# Patient Record
Sex: Female | Born: 1960 | Race: Black or African American | Hispanic: No | Smoking: Never smoker
Health system: Southern US, Community
[De-identification: ages and names within clinical notes are randomized; demographics above are authoritative.]

## PROBLEM LIST (undated history)

## (undated) HISTORY — PX: TUBAL LIGATION: SHX77

---

## 2010-03-07 DIAGNOSIS — Z01419 Encounter for gynecological examination (general) (routine) without abnormal findings: Secondary | ICD-10-CM | POA: Insufficient documentation

## 2010-03-07 DIAGNOSIS — L821 Other seborrheic keratosis: Secondary | ICD-10-CM | POA: Insufficient documentation

## 2010-03-07 DIAGNOSIS — M25511 Pain in right shoulder: Secondary | ICD-10-CM | POA: Insufficient documentation

## 2010-08-20 DIAGNOSIS — N390 Urinary tract infection, site not specified: Secondary | ICD-10-CM | POA: Insufficient documentation

## 2014-10-08 ENCOUNTER — Encounter: Payer: Self-pay | Admitting: Gastroenterology

## 2014-11-30 ENCOUNTER — Encounter: Payer: Self-pay | Admitting: Gastroenterology

## 2014-12-29 ENCOUNTER — Encounter: Payer: Self-pay | Admitting: Gastroenterology

## 2015-09-01 ENCOUNTER — Encounter: Payer: Self-pay | Admitting: Gastroenterology

## 2015-09-13 ENCOUNTER — Encounter: Payer: Self-pay | Admitting: Gastroenterology

## 2016-05-17 ENCOUNTER — Ambulatory Visit
Admission: RE | Admit: 2016-05-17 | Discharge: 2016-05-17 | Disposition: A | Discharge status: Home / Self Care | Payer: Self-pay | Source: Ambulatory Visit | Attending: Oncology | Admitting: Oncology

## 2016-05-17 ENCOUNTER — Ambulatory Visit: Payer: Self-pay | Attending: Oncology | Admitting: *Deleted

## 2016-05-17 ENCOUNTER — Encounter: Payer: Self-pay | Admitting: *Deleted

## 2016-05-17 VITALS — BP 140/83 | HR 94 | Temp 98.8°F | Ht 65.0 in | Wt 204.0 lb

## 2016-05-17 DIAGNOSIS — Z0000 Encounter for general adult medical examination without abnormal findings: Secondary | ICD-10-CM

## 2016-05-17 NOTE — Progress Notes (Signed)
Subjective:     Patient ID: Jennifer Larson, female   DOB: May 28, 1960, 56 y.o.   MRN: [id number]  HPI   Review of Systems     Objective:   Physical Exam  Pulmonary/Chest: Right breast exhibits no inverted nipple, no mass, no nipple discharge, no skin change and no tenderness. Left breast exhibits no inverted nipple, no mass, no nipple discharge, no skin change and no tenderness. Breasts are asymmetrical.  Right breast larger than the left       Assessment:     56 year old Black female presents to [facility] for clinical breast exam and mammogram.  Last pap in 2015 was negative / negative and pap in 2016 was negative, but no HPV testing.  Explained to patient that next pap will be due in 2020.  Clinical breast exam unremarkable.  Taught self breast awareness.  Blood pressure elevated at 140/83.  She is to recheck her blood pressure at [facility] or [facility], and if remains higher than 140/90 she is to follow-up with her primary care provider.  Hand out on hypertention given to patient.  Patient has been screened for eligibility.  She does not have any insurance, Medicare or Medicaid.  She also meets financial eligibility.  Hand-out given on the Affordable Care Act.    Plan:     Screening mammogram ordered.  Will follow up per [facility] protocol.

## 2016-05-17 NOTE — Patient Instructions (Signed)

## 2016-05-23 ENCOUNTER — Encounter: Payer: Self-pay | Admitting: *Deleted

## 2016-05-23 NOTE — Progress Notes (Signed)
Letter mailed from the [facility] to inform patient of her normal mammogram results.  Patient is to follow-up with annual screening in one year.  HSIS to Christy. 

## 2016-09-15 ENCOUNTER — Emergency Department
Admission: EM | Admit: 2016-09-15 | Discharge: 2016-09-15 | Disposition: A | Discharge status: Home / Self Care | Payer: No Typology Code available for payment source | Attending: Emergency Medicine | Admitting: Emergency Medicine

## 2016-09-15 ENCOUNTER — Encounter: Payer: Self-pay | Admitting: Emergency Medicine

## 2016-09-15 DIAGNOSIS — M545 Low back pain: Secondary | ICD-10-CM | POA: Diagnosis present

## 2016-09-15 MED ORDER — CYCLOBENZAPRINE HCL 10 MG PO TABS: 10.0000 mg | ORAL_TABLET | Freq: Three times a day (TID) | ORAL | 0 refills | Status: DC | PRN

## 2016-09-15 MED ORDER — IBUPROFEN 600 MG PO TABS: 600.0000 mg | ORAL_TABLET | Freq: Three times a day (TID) | ORAL | 0 refills | Status: DC | PRN

## 2016-09-15 MED ORDER — TRAMADOL HCL 50 MG PO TABS: 50.0000 mg | ORAL_TABLET | Freq: Four times a day (QID) | ORAL | 0 refills | Status: DC | PRN
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
## 2016-10-10 ENCOUNTER — Emergency Department
Admission: EM | Admit: 2016-10-10 | Discharge: 2016-10-10 | Disposition: A | Discharge status: Home / Self Care | Payer: Self-pay | Attending: Emergency Medicine | Admitting: Emergency Medicine

## 2016-10-10 ENCOUNTER — Encounter: Payer: Self-pay | Admitting: Emergency Medicine

## 2016-10-10 DIAGNOSIS — Z79899 Other long term (current) drug therapy: Secondary | ICD-10-CM | POA: Insufficient documentation

## 2016-10-10 DIAGNOSIS — J3489 Other specified disorders of nose and nasal sinuses: Secondary | ICD-10-CM

## 2016-10-10 DIAGNOSIS — R42 Dizziness and giddiness: Secondary | ICD-10-CM

## 2016-10-10 LAB — BASIC METABOLIC PANEL
ANION GAP: 6 (ref 5–15)
BUN: 15 mg/dL (ref 6–20)
CHLORIDE: 103 mmol/L (ref 101–111)
CO2: 28 mmol/L (ref 22–32)
Calcium: 9.5 mg/dL (ref 8.9–10.3)
Creatinine, Ser: 1 mg/dL (ref 0.44–1.00)
GFR calc non Af Amer: >60 mL/min (ref >60)
Glucose, Bld: 100 mg/dL — ABNORMAL HIGH (ref 65–99)
Potassium: 3.8 mmol/L (ref 3.5–5.1)
SODIUM: 137 mmol/L (ref 135–145)

## 2016-10-10 LAB — URINALYSIS, COMPLETE (UACMP) WITH MICROSCOPIC
BACTERIA UA: NONE SEEN
BILIRUBIN URINE: NEGATIVE
GLUCOSE, UA: NEGATIVE mg/dL
KETONES UR: NEGATIVE mg/dL
LEUKOCYTES UA: NEGATIVE
NITRITE: NEGATIVE
Protein, ur: NEGATIVE mg/dL
RBC / HPF: NONE SEEN RBC/hpf (ref 0–5)
SPECIFIC GRAVITY, URINE: 1.018 (ref 1.005–1.030)
Squamous Epithelial / LPF: NONE SEEN
WBC, UA: NONE SEEN WBC/hpf (ref 0–5)
pH: 6 (ref 5.0–8.0)

## 2016-10-10 LAB — CBC
HEMATOCRIT: 34.6 % — AB (ref 35.0–47.0)
HEMOGLOBIN: 11.7 g/dL — AB (ref 12.0–16.0)
MCH: 28.9 pg (ref 26.0–34.0)
MCHC: 33.8 g/dL (ref 32.0–36.0)
MCV: 85.6 fL (ref 80.0–100.0)
Platelets: 349 10*3/uL (ref 150–440)
RBC: 4.04 MIL/uL (ref 3.80–5.20)
RDW: 14.3 % (ref 11.5–14.5)
WBC: 11 10*3/uL (ref 3.6–11.0)

## 2016-10-10 MED ORDER — OXYMETAZOLINE HCL 0.05 % NA SOLN
1.0000 | Freq: Once | NASAL | Status: AC
  Administered 2016-10-10: 1 via NASAL
  Filled 2016-10-10 (×2): qty 15

## 2016-10-10 NOTE — ED Triage Notes (Signed)
Says dizziness on and off for 2 days.

## 2016-10-10 NOTE — Discharge Instructions (Signed)
Please purchase an over the counter NETI POT or sinus rinse and use it once to twice a day to help open your sinuses.  Please make an appointment to establish care with a primary care physician within the next week for a recheck.  Return to the ED for any concerns.  It was a pleasure to take care of you today, and thank you for coming to our emergency department.  If you have any questions or concerns before leaving please ask the nurse to grab me and I'm more than happy to go through your aftercare instructions again.  If you were prescribed any opioid pain medication today such as Norco, Vicodin, Percocet, morphine, hydrocodone, or oxycodone please make sure you do not drive when you are taking this medication as it can alter your ability to drive safely.  If you have any concerns once you are home that you are not improving or are in fact getting worse before you can make it to your follow-up appointment, please do not hesitate to call [phone] and come back for further evaluation.  Merrily Brittle, MD  Results for orders placed or performed during the hospital encounter of 10/10/16  Basic metabolic panel  Result Value Ref Range   Sodium 137 135 - 145 mmol/L   Potassium 3.8 3.5 - 5.1 mmol/L   Chloride 103 101 - 111 mmol/L   CO2 28 22 - 32 mmol/L   Glucose, Bld 100 (H) 65 - 99 mg/dL   BUN 15 6 - 20 mg/dL   Creatinine, Ser 4.78 0.44 - 1.00 mg/dL   Calcium 9.5 8.9 - 29.5 mg/dL   GFR calc non Af Amer >60 >60 mL/min   GFR calc Af Amer >60 >60 mL/min   Anion gap 6 5 - 15  CBC  Result Value Ref Range   WBC 11.0 3.6 - 11.0 K/uL   RBC 4.04 3.80 - 5.20 MIL/uL   Hemoglobin 11.7 (L) 12.0 - 16.0 g/dL   HCT 62.1 (L) 30.8 - 65.7 %   MCV 85.6 80.0 - 100.0 fL   MCH 28.9 26.0 - 34.0 pg   MCHC 33.8 32.0 - 36.0 g/dL   RDW 84.6 96.2 - 95.2 %   Platelets 349 150 - 440 K/uL  Urinalysis, Complete w Microscopic  Result Value Ref Range   Color, Urine YELLOW (A) YELLOW   APPearance CLEAR (A) CLEAR   Specific Gravity, Urine 1.018 1.005 - 1.030   pH 6.0 5.0 - 8.0   Glucose, UA NEGATIVE NEGATIVE mg/dL   Hgb urine dipstick SMALL (A) NEGATIVE   Bilirubin Urine NEGATIVE NEGATIVE   Ketones, ur NEGATIVE NEGATIVE mg/dL   Protein, ur NEGATIVE NEGATIVE mg/dL   Nitrite NEGATIVE NEGATIVE   Leukocytes, UA NEGATIVE NEGATIVE   RBC / HPF NONE SEEN 0 - 5 RBC/hpf   WBC, UA NONE SEEN 0 - 5 WBC/hpf   Bacteria, UA NONE SEEN NONE SEEN   Squamous Epithelial / LPF NONE SEEN NONE SEEN

## 2016-10-10 NOTE — ED Provider Notes (Signed)
[facility] Emergency Department Provider Note  ____________________________________________   First MD Initiated Contact with Patient 10/10/16 1959     (approximate)  I have reviewed the triage vital signs and the nursing notes.   HISTORY  Chief Complaint Dizziness   HPI Jennifer Larson is a 56 y.o. female who comes to the emergency department with 2 days of intermittent lightheadedness. She said it feels like everything is spinning and it lasts for roughly 5-10 seconds at a time. Nothing in particular seems to make it better or worse although she thinks it might be somewhat worse when she stands up. She has no palpitations. No chest pain. No family history of sudden cardiac death. She takes no medications. She does feel fullness in her sinuses and says she has had similar symptoms in the past when she has had sinusitis. She said no fevers or chills. No sore throat. No ear pain.   History reviewed. No pertinent past medical history.  There are no active problems to display for this patient.   Past Surgical History:  Procedure Laterality Date  . TUBAL LIGATION      Prior to Admission medications   Medication Sig Start Date End Date Taking? Authorizing Provider  cyclobenzaprine (FLEXERIL) 10 MG tablet Take 1 tablet (10 mg total) by mouth 3 (three) times daily as needed. 09/15/16   Joni Reining, PA-C  ibuprofen (ADVIL,MOTRIN) 600 MG tablet Take 1 tablet (600 mg total) by mouth every 8 (eight) hours as needed. 09/15/16   Joni Reining, PA-C  traMADol (ULTRAM) 50 MG tablet Take 1 tablet (50 mg total) by mouth every 6 (six) hours as needed. 09/15/16 09/15/17  Joni Reining, PA-C    Allergies Patient has no known allergies.  Family History  Problem Relation Age of Onset  . Breast cancer Mother     Social History Social History  Substance Use Topics  . Smoking status: Never Smoker  . Smokeless tobacco: Never Used  . Alcohol use No    Review  of Systems Constitutional: No fever/chills Eyes: No visual changes. ENT: No sore throat. Cardiovascular: Denies chest pain. Respiratory: Denies shortness of breath. Gastrointestinal: No abdominal pain.  No nausea, no vomiting.  No diarrhea.  No constipation. Genitourinary: Negative for dysuria. Musculoskeletal: Negative for back pain. Skin: Negative for rash. Neurological: Negative for headaches, focal weakness or numbness.   ____________________________________________   PHYSICAL EXAM:  VITAL SIGNS: ED Triage Vitals  Enc Vitals Group     BP 10/10/16 1843 (!) 159/90     Pulse Rate 10/10/16 1843 91     Resp 10/10/16 1843 16     Temp 10/10/16 1843 98.9 F (37.2 C)     Temp Source 10/10/16 1843 Oral     SpO2 10/10/16 1843 100 %     Weight 10/10/16 1844 204 lb (92.5 kg)     Height 10/10/16 1844  (1.626 m)     Head Circumference --      Peak Flow --      Pain Score 10/10/16 1842 6     Pain Loc --      Pain Edu? --      Excl. in GC? --     Constitutional: alert and oriented 4 well appearing nontoxic no diaphoresis speaks in full clear sentences Eyes: PERRL EOMI. Head: Atraumatic. Nose: No congestion/rhinnorhea. Mouth/Throat: No trismus Neck: No stridor.   Cardiovascular: Normal rate, regular rhythm. Grossly normal heart sounds.  Good peripheral circulation. Respiratory:  Normal respiratory effort.  No retractions. Lungs CTAB and moving good air Musculoskeletal: No lower extremity edema   Neurologic:  Alert and oriented 4 Cranial nerves II through XII intact No pronator drift 5 out of 5 grips, biceps, triceps, hip flexion, hip extension plantar flexion, dorsiflexion Sensation intact to light touch throughout 2+ DTRs and no ankle clonus Normal finger-nose-finger Ambulates with steady gait  Skin:  Skin is warm, dry and intact. No rash noted. Psychiatric: Mood and affect are normal. Speech and behavior are  normal.    ____________________________________________   DIFFERENTIAL includes but not limited to  peripheral vertigo, central vertigo, lightheadedness, dehydration, sinus congestion, arrhythmia ____________________________________________   LABS (all labs ordered are listed, but only abnormal results are displayed)  Labs Reviewed  BASIC METABOLIC PANEL - Abnormal; Notable for the following:       Result Value   Glucose, Bld 100 (*)    All other components within normal limits  CBC - Abnormal; Notable for the following:    Hemoglobin 11.7 (*)    HCT 34.6 (*)    All other components within normal limits  URINALYSIS, COMPLETE (UACMP) WITH MICROSCOPIC - Abnormal; Notable for the following:    Color, Urine YELLOW (*)    APPearance CLEAR (*)    Hgb urine dipstick SMALL (*)    All other components within normal limits    blood work unremarkable __________________________________________  EKG  ED ECG REPORT I, Merrily Brittle, the attending physician, personally viewed and interpreted this ECG.  Date: 10/10/2016 EKG Time:  Rate: 88 Rhythm: normal sinus rhythm QRS Axis: normal Intervals: normal ST/T Wave abnormalities: normal Narrative Interpretation: no evidence of acute ischemia  ____________________________________________  RADIOLOGY   ____________________________________________   PROCEDURES  Procedure(s) performed: no  Procedures  Critical Care performed: no  Observation: no ____________________________________________   INITIAL IMPRESSION / ASSESSMENT AND PLAN / ED COURSE  Pertinent labs & imaging results that were available during my care of the patient were reviewed by me and considered in my medical decision making (see chart for details).  The patient is very well-appearing with a benign exam. History of intermittent lightheadedness that is not entirely vertiginous of unclear etiology. She is currently asymptomatic. She has no chest pain or  palpitations and her EKG shows no concerning signs for ventricular dysrhythmia. At this point she likely has sinus congestion contributed to her lightheadedness. I'll treat her symptomatically and strict return precautions given.      ____________________________________________   FINAL CLINICAL IMPRESSION(S) / ED DIAGNOSES  Final diagnoses:  Lightheaded  Sinus pressure      NEW MEDICATIONS STARTED DURING THIS VISIT:  Discharge Medication List as of 10/10/2016  8:14 PM       Note:  This document was prepared using Dragon voice recognition software and may include unintentional dictation errors.     Merrily Brittle, MD 10/11/16 [phone]

## 2016-10-10 NOTE — ED Notes (Signed)
FIRST NURSE NOTE-ambulatory to check in without difficulty. Declined wheel chair

## 2016-12-15 ENCOUNTER — Emergency Department: Payer: Self-pay

## 2016-12-15 ENCOUNTER — Observation Stay
Admission: EM | Admit: 2016-12-15 | Discharge: 2016-12-17 | Disposition: A | Discharge status: Home / Self Care | Payer: Self-pay | Attending: Surgery | Admitting: Surgery

## 2016-12-15 ENCOUNTER — Encounter: Payer: Self-pay | Admitting: Radiology

## 2016-12-15 ENCOUNTER — Other Ambulatory Visit: Payer: Self-pay

## 2016-12-15 DIAGNOSIS — R109 Unspecified abdominal pain: Secondary | ICD-10-CM

## 2016-12-15 DIAGNOSIS — K219 Gastro-esophageal reflux disease without esophagitis: Secondary | ICD-10-CM | POA: Insufficient documentation

## 2016-12-15 DIAGNOSIS — K8000 Calculus of gallbladder with acute cholecystitis without obstruction: Principal | ICD-10-CM | POA: Insufficient documentation

## 2016-12-15 DIAGNOSIS — K660 Peritoneal adhesions (postprocedural) (postinfection): Secondary | ICD-10-CM | POA: Insufficient documentation

## 2016-12-15 DIAGNOSIS — Z9889 Other specified postprocedural states: Secondary | ICD-10-CM | POA: Insufficient documentation

## 2016-12-15 DIAGNOSIS — Z803 Family history of malignant neoplasm of breast: Secondary | ICD-10-CM | POA: Insufficient documentation

## 2016-12-15 DIAGNOSIS — K5900 Constipation, unspecified: Secondary | ICD-10-CM | POA: Insufficient documentation

## 2016-12-15 DIAGNOSIS — K810 Acute cholecystitis: Secondary | ICD-10-CM

## 2016-12-15 LAB — URINALYSIS, COMPLETE (UACMP) WITH MICROSCOPIC
BACTERIA UA: NONE SEEN
BILIRUBIN URINE: NEGATIVE
GLUCOSE, UA: NEGATIVE mg/dL
HGB URINE DIPSTICK: NEGATIVE
Ketones, ur: 5 mg/dL — AB
LEUKOCYTES UA: NEGATIVE
NITRITE: NEGATIVE
Protein, ur: 30 mg/dL — AB
SPECIFIC GRAVITY, URINE: 1.016 (ref 1.005–1.030)
pH: 8 (ref 5.0–8.0)

## 2016-12-15 LAB — BASIC METABOLIC PANEL
Anion gap: 8 (ref 5–15)
BUN: 12 mg/dL (ref 6–20)
CALCIUM: 9 mg/dL (ref 8.9–10.3)
CO2: 28 mmol/L (ref 22–32)
Chloride: 104 mmol/L (ref 101–111)
Creatinine, Ser: 0.97 mg/dL (ref 0.44–1.00)
GFR calc Af Amer: >60 mL/min (ref >60)
GLUCOSE: 131 mg/dL — AB (ref 65–99)
Potassium: 3.9 mmol/L (ref 3.5–5.1)
SODIUM: 140 mmol/L (ref 135–145)

## 2016-12-15 LAB — TROPONIN I

## 2016-12-15 LAB — CBC
HCT: 34.4 % — ABNORMAL LOW (ref 35.0–47.0)
Hemoglobin: 11.4 g/dL — ABNORMAL LOW (ref 12.0–16.0)
MCH: 28.2 pg (ref 26.0–34.0)
MCHC: 33.1 g/dL (ref 32.0–36.0)
MCV: 85.4 fL (ref 80.0–100.0)
PLATELETS: 368 10*3/uL (ref 150–440)
RBC: 4.02 MIL/uL (ref 3.80–5.20)
RDW: 13.7 % (ref 11.5–14.5)
WBC: 11.5 10*3/uL — AB (ref 3.6–11.0)

## 2016-12-15 LAB — HEPATIC FUNCTION PANEL
ALT: 15 U/L (ref 14–54)
AST: 22 U/L (ref 15–41)
Albumin: 3.2 g/dL — ABNORMAL LOW (ref 3.5–5.0)
Alkaline Phosphatase: 77 U/L (ref 38–126)
Bilirubin, Direct: <0.1 mg/dL — ABNORMAL LOW (ref 0.1–0.5)
TOTAL PROTEIN: 7.2 g/dL (ref 6.5–8.1)
Total Bilirubin: 0.4 mg/dL (ref 0.3–1.2)

## 2016-12-15 LAB — SURGICAL PCR SCREEN
MRSA, PCR: NEGATIVE
STAPHYLOCOCCUS AUREUS: NEGATIVE

## 2016-12-15 LAB — LIPASE, BLOOD: Lipase: 28 U/L (ref 11–51)

## 2016-12-15 MED ORDER — GI COCKTAIL ~~LOC~~
30.0000 mL | Freq: Once | ORAL | Status: AC
  Administered 2016-12-15: 30 mL via ORAL
  Filled 2016-12-15: qty 30

## 2016-12-15 MED ORDER — LACTATED RINGERS IV SOLN
INTRAVENOUS | Status: DC
  Administered 2016-12-15 – 2016-12-17 (×6): via INTRAVENOUS

## 2016-12-15 MED ORDER — MORPHINE SULFATE (PF) 2 MG/ML IV SOLN: 2.0000 mg | INTRAVENOUS | Status: DC | PRN

## 2016-12-15 MED ORDER — IOPAMIDOL (ISOVUE-300) INJECTION 61%
30.0000 mL | Freq: Once | INTRAVENOUS | Status: AC | PRN
  Administered 2016-12-15: 30 mL via ORAL

## 2016-12-15 MED ORDER — KETOROLAC TROMETHAMINE 30 MG/ML IJ SOLN
30.0000 mg | Freq: Four times a day (QID) | INTRAMUSCULAR | Status: DC | PRN
Start: 2016-12-15 — End: 2016-12-17
  Administered 2016-12-17: 30 mg via INTRAVENOUS
  Filled 2016-12-15: qty 1

## 2016-12-15 MED ORDER — PANTOPRAZOLE SODIUM 40 MG IV SOLR
40.0000 mg | Freq: Every day | INTRAVENOUS | Status: DC
  Administered 2016-12-15 – 2016-12-16 (×2): 40 mg via INTRAVENOUS
  Filled 2016-12-15 (×2): qty 40

## 2016-12-15 MED ORDER — DEXTROSE 5 % IV SOLN
2.0000 g | INTRAVENOUS | Status: DC
  Administered 2016-12-15 – 2016-12-16 (×2): 2 g via INTRAVENOUS
  Filled 2016-12-15 (×3): qty 2

## 2016-12-15 MED ORDER — IOPAMIDOL (ISOVUE-300) INJECTION 61%
100.0000 mL | Freq: Once | INTRAVENOUS | Status: AC | PRN
  Administered 2016-12-15: 100 mL via INTRAVENOUS

## 2016-12-15 MED ORDER — ONDANSETRON HCL 4 MG/2ML IJ SOLN
4.0000 mg | Freq: Four times a day (QID) | INTRAMUSCULAR | Status: DC | PRN
Start: 2016-12-15 — End: 2016-12-17
  Administered 2016-12-16: 4 mg via INTRAVENOUS

## 2016-12-15 MED ORDER — ENOXAPARIN SODIUM 40 MG/0.4ML ~~LOC~~ SOLN
40.0000 mg | SUBCUTANEOUS | Status: DC
  Administered 2016-12-15 – 2016-12-16 (×2): 40 mg via SUBCUTANEOUS
  Filled 2016-12-15 (×2): qty 0.4

## 2016-12-15 MED ORDER — ONDANSETRON 4 MG PO TBDP: 4.0000 mg | ORAL_TABLET | Freq: Four times a day (QID) | ORAL | Status: DC | PRN

## 2016-12-15 MED ORDER — HYDRALAZINE HCL 20 MG/ML IJ SOLN: 10.0000 mg | INTRAMUSCULAR | Status: DC | PRN

## 2016-12-15 NOTE — ED Notes (Signed)
Patient transported to Ultrasound, pt was stable at time of transport and in NAD.

## 2016-12-15 NOTE — ED Notes (Signed)
Patient reports burning feeling in abdomen, chest and back x 2 days intermittently.  Patient states this morning pain is only in mid-abdomen.  Patient reports vomiting yesterday.  Denies vomiting today.

## 2016-12-15 NOTE — ED Notes (Signed)
First Nurse Note:  Patient presents complaining of "burning" in chest and through to stomach X 2 days.

## 2016-12-15 NOTE — H&P (Signed)
Patient ID: Jennifer Larson, female   DOB: 12/28/1960, 56 y.o.   MRN: [id number]  HPI Jennifer Larson is a 56 y.o. female comes to the emergency room for a two-day history of upper abdominal discomfort for, some reflux and some pain. Patient reports that the pain is intermittent moderate in intensity and radiates to her back. She denies any fevers. She did have some nausea and vomiting, also reports some constipation. She had a similar episode in the past and was told that she had gallbladder issues. Only surgical operation its tubal ligation. She is able to perform more than 6 Mets of activity without shortness of breath or chest pain. No evidence of biliary obstruction or cholangitis. Ultrasound personally reviewed there is evidence of multiple gallstones with mild thickening of the gallbladder wall. CT scan also personally reviewed showing evidence consistent with early acute cholecystitis. No evidence of biliary obstruction. Normal common bile duct LFTs are normal as well as her CBC.  HPI  History reviewed. No pertinent past medical history.  Past Surgical History:  Procedure Laterality Date  . TUBAL LIGATION      Family History  Problem Relation Age of Onset  . Breast cancer Mother     Social History Social History   Tobacco Use  . Smoking status: Never Smoker  . Smokeless tobacco: Never Used  Substance Use Topics  . Alcohol use: No  . Drug use: No    No Known Allergies  Current Facility-Administered Medications  Medication Dose Route Frequency Provider Last Rate Last Dose  . cefTRIAXone (ROCEPHIN) 2 g in dextrose 5 % 50 mL IVPB  2 g Intravenous Q24H Joliene Salvador F, MD      . enoxaparin (LOVENOX) injection 40 mg  40 mg Subcutaneous Q24H Vira Chaplin F, MD      . hydrALAZINE (APRESOLINE) injection 10 mg  10 mg Intravenous Q2H PRN Ferlin Fairhurst F, MD      . ketorolac (TORADOL) 30 MG/ML injection 30 mg  30 mg Intravenous Q6H PRN Tison Leibold F, MD      . lactated ringers  infusion   Intravenous Continuous Xyla Leisner F, MD      . morphine 2 MG/ML injection 2 mg  2 mg Intravenous Q3H PRN Marah Park F, MD      . ondansetron (ZOFRAN-ODT) disintegrating tablet 4 mg  4 mg Oral Q6H PRN Chanique Duca F, MD       Or  . ondansetron (ZOFRAN) injection 4 mg  4 mg Intravenous Q6H PRN Oshua Mcconaha F, MD      . pantoprazole (PROTONIX) injection 40 mg  40 mg Intravenous QHS Nga Rabon, Merri Rayiego F, MD       Current Outpatient Medications  Medication Sig Dispense Refill  . cyclobenzaprine (FLEXERIL) 10 MG tablet Take 1 tablet (10 mg total) by mouth 3 (three) times daily as needed. (Patient not taking: Reported on 12/15/2016) 15 tablet 0  . ibuprofen (ADVIL,MOTRIN) 600 MG tablet Take 1 tablet (600 mg total) by mouth every 8 (eight) hours as needed. (Patient not taking: Reported on 12/15/2016) 15 tablet 0  . traMADol (ULTRAM) 50 MG tablet Take 1 tablet (50 mg total) by mouth every 6 (six) hours as needed. (Patient not taking: Reported on 12/15/2016) 20 tablet 0     Review of Systems Full ROS  was asked and was negative except for the information on the HPI  Physical Exam Blood pressure 128/72, pulse (!) 104, temperature 99.6 F (37.6 C), temperature source Oral, resp. rate  17, height 5\' 4"  (1.626 m), weight 89.4 kg (197 lb), SpO2 100 %. CONSTITUTIONAL: NAD EYES: Pupils are equal, round, and reactive to light, Sclera are non-icteric. EARS, NOSE, MOUTH AND THROAT: The oropharynx is clear. The oral mucosa is pink and moist. Hearing is intact to voice. LYMPH NODES:  Lymph nodes in the neck are normal. RESPIRATORY:  Lungs are clear. There is normal respiratory effort, with equal breath sounds bilaterally, and without pathologic use of accessory muscles. CARDIOVASCULAR: Heart is regular without murmurs, gallops, or rubs. GI: The abdomen is soft, Mild TTP Epigastrium and RUQ. No peritonitis.There are no palpable masses. There is no hepatosplenomegaly. There are normal bowel sounds in all  quadrants. GU: Rectal deferred.   MUSCULOSKELETAL: Normal muscle strength and tone. No cyanosis or edema.   SKIN: Turgor is good and there are no pathologic skin lesions or ulcers. NEUROLOGIC: Motor and sensation is grossly normal. Cranial nerves are grossly intact. PSYCH:  Oriented to person, place and time. Affect is normal.  Data Reviewed  I have personally reviewed the patient's imaging, laboratory findings and medical records.    Assessment/Plan Clinical findings consistent with acute cholecystitis confirmed by CT and ultrasound. Discussed with the patient in detail that the best course of therapy will be to admit her to the hospital start IV antibiotics IV fluids and schedule her for a laparoscopic cholecystectomy in the morning. The risks, benefits, complications, treatment options, and expected outcomes were discussed with the patient. The possibilities of bleeding, recurrent infection, finding a normal gallbladder, perforation of viscus organs, damage to surrounding structures, bile leak, abscess formation, needing a drain placed, the need for additional procedures, reaction to medication, pulmonary aspiration,  failure to diagnose a condition, the possible need to convert to an open procedure, and creating a complication requiring transfusion or operation were discussed with the patient. The patient and/or family concurred with the proposed plan, giving informed consent.   [name], MD FACS General Surgeon 12/15/2016, 5:30 PM

## 2016-12-15 NOTE — ED Notes (Signed)
Pt able to ambulate to bathroom independently and denies dizziness or lightheadedness. NAD noted at this time. To aware of wait for General Surgery consult.

## 2016-12-15 NOTE — ED Provider Notes (Signed)
[facility] Emergency Department Provider Note   ____________________________________________   First MD Initiated Contact with Patient 12/15/16 1031     (approximate)  I have reviewed the triage vital signs and the nursing notes.   HISTORY  Chief Complaint Upper abdominal pain   HPI Jennifer Larson is a 56 y.o. female reports she has been having about 2-3 days of discomfort in her upper abdomen described as moderate in intensity and somewhat intermittent.  The pain comes and goes and does radiate towards her back.  She has had nausea but no vomiting today, did vomit yesterday.  No fevers or chills, but reports she does feel a little bit cold.  Denies chest pain, but reports she did experience a burning discomfort in her upper chest after the symptoms started.  She states it feels like "acid".  No trouble breathing.  Reports ongoing discomfort in her upper abdomen which she describes as mild at present.  Denies pregnancy.  No vaginal symptoms.  Postmenopausal.   No past medical history on file.  There are no active problems to display for this patient.   Past Surgical History:  Procedure Laterality Date  . TUBAL LIGATION      Prior to Admission medications   Medication Sig Start Date End Date Taking? Authorizing Provider  cyclobenzaprine (FLEXERIL) 10 MG tablet Take 1 tablet (10 mg total) by mouth 3 (three) times daily as needed. Patient not taking: Reported on 12/15/2016 09/15/16   Joni ReiningSmith, Ronald K, PA-C  ibuprofen (ADVIL,MOTRIN) 600 MG tablet Take 1 tablet (600 mg total) by mouth every 8 (eight) hours as needed. Patient not taking: Reported on 12/15/2016 09/15/16   Joni ReiningSmith, Ronald K, PA-C  traMADol (ULTRAM) 50 MG tablet Take 1 tablet (50 mg total) by mouth every 6 (six) hours as needed. Patient not taking: Reported on 12/15/2016 09/15/16 09/15/17  Joni ReiningSmith, Ronald K, PA-C    Allergies Patient has no known allergies.  Family History  Problem Relation  Age of Onset  . Breast cancer Mother     Social History Social History   Tobacco Use  . Smoking status: Never Smoker  . Smokeless tobacco: Never Used  Substance Use Topics  . Alcohol use: No  . Drug use: No    Review of Systems Constitutional: No fever/chills Eyes: No visual changes. ENT: No sore throat. Cardiovascular: Denies chest pain. Respiratory: Denies shortness of breath. Gastrointestinal: No abdominal pain.  No nausea, no vomiting.  No diarrhea.  No constipation. Genitourinary: Negative for dysuria. Musculoskeletal: Negative for back pain. Skin: Negative for rash. Neurological: Negative for headaches, focal weakness or numbness.    ____________________________________________   PHYSICAL EXAM:  VITAL SIGNS: ED Triage Vitals  Enc Vitals Group     BP 12/15/16 0759 132/74     Pulse Rate 12/15/16 0759 98     Resp 12/15/16 0759 18     Temp 12/15/16 0759 99.6 F (37.6 C)     Temp Source 12/15/16 0759 Oral     SpO2 12/15/16 0759 99 %     Weight 12/15/16 0800 197 lb (89.4 kg)     Height 12/15/16 0800 5\' 4"  (1.626 m)     Head Circumference --      Peak Flow --      Pain Score 12/15/16 0806 9     Pain Loc --      Pain Edu? --      Excl. in GC? --     Constitutional: Alert and oriented. Well  appearing and in no acute distress. Eyes: Conjunctivae are normal. Head: Atraumatic. Nose: No congestion/rhinnorhea. Mouth/Throat: Mucous membranes are moist. Neck: No stridor.   Cardiovascular: Normal rate, regular rhythm. Grossly normal heart sounds.  Good peripheral circulation. Respiratory: Normal respiratory effort.  No retractions. Lungs CTAB. Gastrointestinal: Soft and nontender for mild discomfort in the epigastrium with no focal findings.  No lower abdominal pain is elicited.  No pain at McBurney's point.  Equivocal Murphy. No distention. Musculoskeletal: No lower extremity tenderness nor edema. Neurologic:  Normal speech and language. No gross focal neurologic  deficits are appreciated.  Skin:  Skin is warm, dry and intact. No rash noted. Psychiatric: Mood and affect are normal. Speech and behavior are normal.  ____________________________________________   LABS (all labs ordered are listed, but only abnormal results are displayed)  Labs Reviewed  BASIC METABOLIC PANEL - Abnormal; Notable for the following components:      Result Value   Glucose, Bld 131 (*)    All other components within normal limits  CBC - Abnormal; Notable for the following components:   WBC 11.5 (*)    Hemoglobin 11.4 (*)    HCT 34.4 (*)    All other components within normal limits  HEPATIC FUNCTION PANEL - Abnormal; Notable for the following components:   Albumin 3.2 (*)    Bilirubin, Direct <0.1 (*)    All other components within normal limits  TROPONIN I  LIPASE, BLOOD  URINALYSIS, COMPLETE (UACMP) WITH MICROSCOPIC   ____________________________________________  EKG  Reviewed and interpreted by me at 8 AM 5 QRS 89 QTC 440 Normal sinus rhythm, no evidence of acute ischemia. ____________________________________________  RADIOLOGY  Dg Chest 2 View  Result Date: 12/15/2016 CLINICAL DATA:  Patient with burning sensation in the chest. EXAM: CHEST  2 VIEW COMPARISON:  None. FINDINGS: Normal cardiac and mediastinal contours. No consolidative pulmonary opacities. No pleural effusion or pneumothorax. Regional skeleton unremarkable. IMPRESSION: No active cardiopulmonary disease. Electronically Signed   By: Annia Belt M.D.   On: 12/15/2016 09:06   Dg Abd 2 Views  Result Date: 12/15/2016 CLINICAL DATA:  56 year old female with 5 days of upper abdominal pain, nausea, vomiting. EXAM: ABDOMEN - 2 VIEW COMPARISON:  Chest radiographs 0852 hours today. FINDINGS: Upright and supine views of the abdomen and pelvis. Stable and negative visible lung bases. No pneumoperitoneum. Non obstructed bowel gas pattern. Retained stool in the right colon and redundant appearing  transverse colon. Otherwise normal abdominal and pelvic visceral contours. No osseous abnormality identified. IMPRESSION: Normal bowel gas pattern, no free air. Electronically Signed   By: Odessa Fleming M.D.   On: 12/15/2016 11:06   US Abdomen Limited Ruq  Result Date: 12/15/2016 CLINICAL DATA:  56 year old female with acute abdominal pain for 5 days. EXAM: ULTRASOUND ABDOMEN LIMITED RIGHT UPPER QUADRANT COMPARISON:  None. FINDINGS: Gallbladder: Multiple mobile gallstones are identified, the largest measuring 1.5 cm. Gallbladder wall thickening is present. There is no evidence of pericholecystic fluid or definite sonographic Murphy sign. Common bile duct: Diameter: 3.5 mm. No intrahepatic or extrahepatic biliary dilatation. Liver: No focal lesion identified. Within normal limits in parenchymal echogenicity. Portal vein is patent on color Doppler imaging with normal direction of blood flow towards the liver. IMPRESSION: 1. Cholelithiasis with gallbladder wall thickening suspicious for acute cholecystitis. No biliary dilatation. 2. No other significant abnormalities. Electronically Signed   By: Harmon Pier M.D.   On: 12/15/2016 11:41    Images reviewed, abdomen negative for acute.  Ultrasound demonstrates cholelithiasis with  gallbladder thickening suspicious for acute cholecystitis. ____________________________________________   PROCEDURES  Procedure(s) performed: None  Procedures  Critical Care performed: No  ____________________________________________   INITIAL IMPRESSION / ASSESSMENT AND PLAN / ED COURSE  Pertinent labs & imaging results that were available during my care of the patient were reviewed by me and considered in my medical decision making (see chart for details).  Differential diagnosis includes, but is not limited to, biliary disease (biliary colic, acute cholecystitis, cholangitis, choledocholithiasis, etc), intrathoracic causes for epigastric abdominal pain including ACS,  gastritis, duodenitis, pancreatitis, small bowel or large bowel obstruction, abdominal aortic aneurysm, hernia, and gastritis.   Clinical Course as of Dec 16 1523  Fri Dec 15, 2016  1437 Patient is resting comfortably, understanding of waiting for general surgery Dr. Everlene FarrierPabon.Spoke with Dr. Valaria GoodPabone (now out of surgery), he reports he will be down to see the patient shortly  [MQ]    Clinical Course User Index [MQ] Sharyn CreamerQuale, Mark, MD   ----------------------------------------- 3:23 PM on 12/15/2016 -----------------------------------------  Dr. Everlene FarrierPabon has seen the patient.  He discussed with the patient, and advised her symptoms are somewhat atypical for gallbladder disease.  He discussed with the patient and he and patient are agreeable with the plan to obtain a CT scan of the abdomen and pelvis to further evaluate, he would then like to be updated thereafter to develop a final plan on whether or not the patient will come inpatient or potentially go home under his close follow-up.  CT abdomen pelvis ordered.  Ongoing care assigned to Dr. Marisa SeverinSiadecki with plan to follow-up on CT abdomen and pelvis, urinalysis, and discuss with Dr. Everlene FarrierPabon for further recommendations thereafter  ____________________________________________   FINAL CLINICAL IMPRESSION(S) / ED DIAGNOSES  Final diagnoses:  Abdominal pain      NEW MEDICATIONS STARTED DURING THIS VISIT:  This SmartLink is deprecated. Use AVSMEDLIST instead to display the medication list for a patient.   Note:  This document was prepared using Dragon voice recognition software and may include unintentional dictation errors.     Sharyn CreamerQuale, Mark, MD 12/15/16 1525

## 2016-12-15 NOTE — ED Notes (Signed)
Patient to radiology.

## 2016-12-15 NOTE — Progress Notes (Signed)
CH responded to an OR for an AD. Pt is awake and alert. Several family members are beside. CH educated Pt on the AD. Pt will consider the document and completion at a later time.    12/15/16 1900  Clinical Encounter Type  Visited With Patient;Patient and family together  Visit Type Initial;Spiritual support  Referral From Nurse  Consult/Referral To Chaplain  Spiritual Encounters  Spiritual Needs Literature

## 2016-12-15 NOTE — ED Triage Notes (Signed)
Patient amb. To ED complaining "burning and hurting" in her chest X 5 days.  Started Sun PM with N&V.  Felt better yesterday and tried to eat.  Here this AM complaining of pain in chest and back all night, but now it's not hurting in her chest this AM, but in her stomach. "I need to use the bathroom but can't".

## 2016-12-15 NOTE — ED Notes (Signed)
Pt currently drinking oral contrast. Pt denies nausea at this time. Call bell in reach to alert RN when finished

## 2016-12-15 NOTE — ED Notes (Signed)
Patient transported to CT 

## 2016-12-15 NOTE — ED Notes (Signed)
[facility] by this Nurse - Lillia AbedKaren Oather Muilenburg RN.

## 2016-12-16 ENCOUNTER — Observation Stay: Payer: Self-pay | Admitting: Anesthesiology

## 2016-12-16 ENCOUNTER — Encounter
Admission: EM | Disposition: A | Discharge status: Home / Self Care | Payer: Self-pay | Source: Home / Self Care | Attending: Emergency Medicine

## 2016-12-16 ENCOUNTER — Encounter: Payer: Self-pay | Admitting: Anesthesiology

## 2016-12-16 HISTORY — PX: CHOLECYSTECTOMY: SHX55

## 2016-12-16 LAB — CBC
HCT: 30.6 % — ABNORMAL LOW (ref 35.0–47.0)
Hemoglobin: 10.2 g/dL — ABNORMAL LOW (ref 12.0–16.0)
MCH: 28.5 pg (ref 26.0–34.0)
MCHC: 33.2 g/dL (ref 32.0–36.0)
MCV: 85.8 fL (ref 80.0–100.0)
PLATELETS: 313 10*3/uL (ref 150–440)
RBC: 3.57 MIL/uL — AB (ref 3.80–5.20)
RDW: 13.7 % (ref 11.5–14.5)
WBC: 10.7 10*3/uL (ref 3.6–11.0)

## 2016-12-16 LAB — COMPREHENSIVE METABOLIC PANEL
ALT: 15 U/L (ref 14–54)
ANION GAP: 7 (ref 5–15)
AST: 19 U/L (ref 15–41)
Albumin: 2.7 g/dL — ABNORMAL LOW (ref 3.5–5.0)
Alkaline Phosphatase: 71 U/L (ref 38–126)
BUN: 6 mg/dL (ref 6–20)
CALCIUM: 8.4 mg/dL — AB (ref 8.9–10.3)
CHLORIDE: 105 mmol/L (ref 101–111)
CO2: 26 mmol/L (ref 22–32)
Creatinine, Ser: 0.68 mg/dL (ref 0.44–1.00)
GFR calc non Af Amer: >60 mL/min (ref >60)
Glucose, Bld: 107 mg/dL — ABNORMAL HIGH (ref 65–99)
POTASSIUM: 3.6 mmol/L (ref 3.5–5.1)
SODIUM: 138 mmol/L (ref 135–145)
Total Bilirubin: 0.7 mg/dL (ref 0.3–1.2)
Total Protein: 6.5 g/dL (ref 6.5–8.1)

## 2016-12-16 SURGERY — LAPAROSCOPIC CHOLECYSTECTOMY
Anesthesia: General

## 2016-12-16 MED ORDER — FENTANYL CITRATE (PF) 100 MCG/2ML IJ SOLN
INTRAMUSCULAR | Status: AC
  Filled 2016-12-16: qty 2

## 2016-12-16 MED ORDER — BUPIVACAINE-EPINEPHRINE (PF) 0.25% -1:200000 IJ SOLN
INTRAMUSCULAR | Status: AC
  Filled 2016-12-16: qty 30

## 2016-12-16 MED ORDER — PROPOFOL 10 MG/ML IV BOLUS
INTRAVENOUS | Status: AC
  Filled 2016-12-16: qty 20

## 2016-12-16 MED ORDER — OXYCODONE-ACETAMINOPHEN 5-325 MG PO TABS
1.0000 | ORAL_TABLET | ORAL | Status: DC | PRN
  Administered 2016-12-16: 2 via ORAL
  Administered 2016-12-16 (×2): 1 via ORAL
  Administered 2016-12-17: 2 via ORAL
  Filled 2016-12-16: qty 1
  Filled 2016-12-16 (×2): qty 2
  Filled 2016-12-16: qty 1

## 2016-12-16 MED ORDER — LIDOCAINE HCL (PF) 2 % IJ SOLN
INTRAMUSCULAR | Status: AC
  Filled 2016-12-16: qty 10

## 2016-12-16 MED ORDER — LIDOCAINE HCL (CARDIAC) 20 MG/ML IV SOLN
INTRAVENOUS | Status: DC | PRN
  Administered 2016-12-16: 40 mg via INTRAVENOUS

## 2016-12-16 MED ORDER — MIDAZOLAM HCL 2 MG/2ML IJ SOLN
INTRAMUSCULAR | Status: DC | PRN
  Administered 2016-12-16: 2 mg via INTRAVENOUS

## 2016-12-16 MED ORDER — BUPIVACAINE-EPINEPHRINE 0.25% -1:200000 IJ SOLN
INTRAMUSCULAR | Status: DC | PRN
  Administered 2016-12-16: 30 mL

## 2016-12-16 MED ORDER — PROMETHAZINE HCL 25 MG/ML IJ SOLN: 6.2500 mg | INTRAMUSCULAR | Status: DC | PRN

## 2016-12-16 MED ORDER — MIDAZOLAM HCL 2 MG/2ML IJ SOLN
INTRAMUSCULAR | Status: AC
Start: 2016-12-16 — End: 2016-12-16
  Filled 2016-12-16: qty 2

## 2016-12-16 MED ORDER — KETOROLAC TROMETHAMINE 30 MG/ML IJ SOLN
INTRAMUSCULAR | Status: DC | PRN
  Administered 2016-12-16: 30 mg via INTRAVENOUS

## 2016-12-16 MED ORDER — ROCURONIUM BROMIDE 100 MG/10ML IV SOLN
INTRAVENOUS | Status: DC | PRN
  Administered 2016-12-16: 40 mg via INTRAVENOUS
  Administered 2016-12-16: 10 mg via INTRAVENOUS

## 2016-12-16 MED ORDER — FENTANYL CITRATE (PF) 100 MCG/2ML IJ SOLN
INTRAMUSCULAR | Status: DC | PRN
  Administered 2016-12-16 (×4): 50 ug via INTRAVENOUS

## 2016-12-16 MED ORDER — PROPOFOL 10 MG/ML IV BOLUS
INTRAVENOUS | Status: DC | PRN
  Administered 2016-12-16: 160 mg via INTRAVENOUS

## 2016-12-16 MED ORDER — FENTANYL CITRATE (PF) 100 MCG/2ML IJ SOLN
25.0000 ug | INTRAMUSCULAR | Status: DC | PRN
  Administered 2016-12-16 (×2): 50 ug via INTRAVENOUS

## 2016-12-16 MED ORDER — FENTANYL CITRATE (PF) 100 MCG/2ML IJ SOLN
INTRAMUSCULAR | Status: AC
  Administered 2016-12-16: 11:00:00
  Filled 2016-12-16: qty 2

## 2016-12-16 MED ORDER — ROCURONIUM BROMIDE 50 MG/5ML IV SOLN
INTRAVENOUS | Status: AC
  Filled 2016-12-16: qty 1

## 2016-12-16 MED ORDER — ACETAMINOPHEN 10 MG/ML IV SOLN
INTRAVENOUS | Status: AC
  Filled 2016-12-16: qty 100

## 2016-12-16 MED ORDER — DEXAMETHASONE SODIUM PHOSPHATE 10 MG/ML IJ SOLN
INTRAMUSCULAR | Status: DC | PRN
  Administered 2016-12-16: 10 mg via INTRAVENOUS

## 2016-12-16 SURGICAL SUPPLY — 47 items
APPLICATOR COTTON TIP 6IN STRL (MISCELLANEOUS) ×2 IMPLANT
APPLIER CLIP 5 13 M/L LIGAMAX5 (MISCELLANEOUS) ×2
BLADE SURG 15 STRL LF DISP TIS (BLADE) ×1 IMPLANT
BLADE SURG 15 STRL SS (BLADE) ×1
BULB RESERV EVAC DRAIN JP 100C (MISCELLANEOUS) ×2 IMPLANT
CANISTER SUCT 1200ML W/VALVE (MISCELLANEOUS) ×2 IMPLANT
CHLORAPREP W/TINT 26ML (MISCELLANEOUS) ×2 IMPLANT
CHOLANGIOGRAM CATH TAUT (CATHETERS) IMPLANT
CLEANER CAUTERY TIP 5X5 PAD (MISCELLANEOUS) ×1 IMPLANT
CLIP APPLIE 5 13 M/L LIGAMAX5 (MISCELLANEOUS) ×1 IMPLANT
DECANTER SPIKE VIAL GLASS SM (MISCELLANEOUS) IMPLANT
DERMABOND ADVANCED (GAUZE/BANDAGES/DRESSINGS) ×1
DERMABOND ADVANCED .7 DNX12 (GAUZE/BANDAGES/DRESSINGS) ×1 IMPLANT
DRAIN CHANNEL JP 19F (MISCELLANEOUS) ×2 IMPLANT
DRAPE C-ARM XRAY 36X54 (DRAPES) IMPLANT
ELECT CAUTERY BLADE 6.4 (BLADE) ×2 IMPLANT
ELECT REM PT RETURN 9FT ADLT (ELECTROSURGICAL) ×2
ELECTRODE REM PT RTRN 9FT ADLT (ELECTROSURGICAL) ×1 IMPLANT
GLOVE BIO SURGEON STRL SZ7 (GLOVE) ×8 IMPLANT
GOWN STRL REUS W/ TWL LRG LVL3 (GOWN DISPOSABLE) ×2 IMPLANT
GOWN STRL REUS W/TWL LRG LVL3 (GOWN DISPOSABLE) ×2
IRRIGATION STRYKERFLOW (MISCELLANEOUS) ×1 IMPLANT
IRRIGATOR STRYKERFLOW (MISCELLANEOUS) ×2
IV CATH ANGIO 12GX3 LT BLUE (NEEDLE) IMPLANT
IV NS 1000ML (IV SOLUTION) ×1
IV NS 1000ML BAXH (IV SOLUTION) ×1 IMPLANT
L-HOOK LAP DISP 36CM (ELECTROSURGICAL) ×2
LHOOK LAP DISP 36CM (ELECTROSURGICAL) ×1 IMPLANT
NEEDLE HYPO 22GX1.5 SAFETY (NEEDLE) ×2 IMPLANT
PACK LAP CHOLECYSTECTOMY (MISCELLANEOUS) ×2 IMPLANT
PAD CLEANER CAUTERY TIP 5X5 (MISCELLANEOUS) ×1
PENCIL ELECTRO HAND CTR (MISCELLANEOUS) ×2 IMPLANT
POUCH SPECIMEN RETRIEVAL 10MM (ENDOMECHANICALS) ×2 IMPLANT
SCISSORS METZENBAUM CVD 33 (INSTRUMENTS) ×2 IMPLANT
SLEEVE ENDOPATH XCEL 5M (ENDOMECHANICALS) ×4 IMPLANT
SOL ANTI-FOG 6CC FOG-OUT (MISCELLANEOUS) ×1 IMPLANT
SOL FOG-OUT ANTI-FOG 6CC (MISCELLANEOUS) ×1
SPONGE LAP 18X18 5 PK (GAUZE/BANDAGES/DRESSINGS) ×2 IMPLANT
STOPCOCK 4 WAY LG BORE MALE ST (IV SETS) IMPLANT
SUT ETHIBOND 0 MO6 C/R (SUTURE) IMPLANT
SUT MNCRL AB 4-0 PS2 18 (SUTURE) ×2 IMPLANT
SUT VICRYL 0 AB UR-6 (SUTURE) ×4 IMPLANT
SYR 20CC LL (SYRINGE) ×2 IMPLANT
TROCAR XCEL BLUNT TIP 100MML (ENDOMECHANICALS) ×2 IMPLANT
TROCAR XCEL NON-BLD 5MMX100MML (ENDOMECHANICALS) ×2 IMPLANT
TUBING INSUFFLATION (TUBING) ×2 IMPLANT
WATER STERILE IRR 1000ML POUR (IV SOLUTION) IMPLANT

## 2016-12-16 NOTE — Anesthesia Preprocedure Evaluation (Signed)
Anesthesia Evaluation  Patient identified by MRN, date of birth, ID band Patient awake    Reviewed: Allergy & Precautions, H&P , NPO status , Patient's Chart, lab work & pertinent test results, reviewed documented beta blocker date and time   History of Anesthesia Complications Negative for: history of anesthetic complications  Airway Mallampati: III  TM Distance: >3 FB Neck ROM: full    Dental  (+) Dental Advidsory Given, Missing   Pulmonary neg pulmonary ROS,           Cardiovascular Exercise Tolerance: Good negative cardio ROS       Neuro/Psych negative neurological ROS  negative psych ROS   GI/Hepatic negative GI ROS, Neg liver ROS,   Endo/Other  negative endocrine ROS  Renal/GU negative Renal ROS  negative genitourinary   Musculoskeletal   Abdominal   Peds  Hematology negative hematology ROS (+)   Anesthesia Other Findings History reviewed. No pertinent past medical history.   Reproductive/Obstetrics negative OB ROS                             Anesthesia Physical Anesthesia Plan  ASA: II  Anesthesia Plan: General   Post-op Pain Management:    Induction: Intravenous  PONV Risk Score and Plan: 3 and Ondansetron and Dexamethasone  Airway Management Planned: Oral ETT  Additional Equipment:   Intra-op Plan:   Post-operative Plan: Extubation in OR  Informed Consent: I have reviewed the patients History and Physical, chart, labs and discussed the procedure including the risks, benefits and alternatives for the proposed anesthesia with the patient or authorized representative who has indicated his/her understanding and acceptance.   Dental Advisory Given  Plan Discussed with: Anesthesiologist, CRNA and Surgeon  Anesthesia Plan Comments:         Anesthesia Quick Evaluation

## 2016-12-16 NOTE — Transfer of Care (Signed)
Immediate Anesthesia Transfer of Care Note  Patient: Jennifer Larson  Procedure(s) Performed: LAPAROSCOPIC CHOLECYSTECTOMY (N/A )  Patient Location: PACU  Anesthesia Type:General  Level of Consciousness: sedated  Airway & Oxygen Therapy: Patient Spontanous Breathing and Patient connected to face mask oxygen  Post-op Assessment: Report given to RN and Post -op Vital signs reviewed and stable  Post vital signs: Reviewed and stable  Last Vitals:  Vitals:   12/16/16 0510 12/16/16 1010  BP: 132/73 (P) 138/68  Pulse: 94   Resp:  (!) (P) 21  Temp: 37.2 C (P) 37 C  SpO2: 100% (P) 100%    Last Pain:  Vitals:   12/16/16 0510  TempSrc: Oral  PainSc:          Complications: No apparent anesthesia complications

## 2016-12-16 NOTE — Anesthesia Procedure Notes (Signed)
Procedure Name: Intubation Date/Time: 12/16/2016 8:22 AM Performed by: Allean Found, CRNA Pre-anesthesia Checklist: Patient identified, Patient being monitored, Timeout performed, Emergency Drugs available and Suction available Patient Re-evaluated:Patient Re-evaluated prior to induction Oxygen Delivery Method: Circle system utilized Preoxygenation: Pre-oxygenation with 100% oxygen Induction Type: IV induction Ventilation: Mask ventilation without difficulty Laryngoscope Size: Mac and 3 Grade View: Grade I Tube type: Oral Tube size: 7.0 mm Number of attempts: 1 Airway Equipment and Method: Stylet Placement Confirmation: ETT inserted through vocal cords under direct vision,  positive ETCO2 and breath sounds checked- equal and bilateral Secured at: 21 cm Tube secured with: Tape Dental Injury: Teeth and Oropharynx as per pre-operative assessment

## 2016-12-16 NOTE — Op Note (Signed)
Laparoscopic Cholecystectomy  Pre-operative Diagnosis: Acute cholecystitis  Post-operative Diagnosis: same  Procedure: laparoscopic cholecystectomy  Surgeon: Sterling Bigiego Pabon, MD FACS  Anesthesia: Gen. with endotracheal tube    Findings:  Cholecystitis w significant inflammatory response around liver bed  Estimated Blood Loss: 50cc         Drains: 19 FR RUQ         Specimens: Gallbladder           Complications: none   Procedure Details  The patient was seen again in the Holding Room. The benefits, complications, treatment options, and expected outcomes were discussed with the patient. The risks of bleeding, infection, recurrence of symptoms, failure to resolve symptoms, bile duct damage, bile duct leak, retained common bile duct stone, bowel injury, any of which could require further surgery and/or ERCP, stent, or papillotomy were reviewed with the patient. The likelihood of improving the patient's symptoms with return to their baseline status is good.  The patient and/or family concurred with the proposed plan, giving informed consent.  The patient was taken to Operating Room, identified as Earl Litesatricia Reber and the procedure verified as Laparoscopic Cholecystectomy.  A Time Out was held and the above information confirmed.  Prior to the induction of general anesthesia, antibiotic prophylaxis was administered. VTE prophylaxis was in place. General endotracheal anesthesia was then administered and tolerated well. After the induction, the abdomen was prepped with Chloraprep and draped in the sterile fashion. The patient was positioned in the supine position.  Cut down technique was used to enter the abdominal cavity and a Hasson trochar was placed after two vicryl stitches were anchored to the fascia. Pneumoperitoneum was then created with CO2 and tolerated well without any adverse changes in the patient's vital signs.  Three 5-mm ports were placed in the right upper quadrant all under  direct vision. All skin incisions  were infiltrated with a local anesthetic agent before making the incision and placing the trocars.   The patient was positioned  in reverse Trendelenburg, tilted slightly to the patient's left.  The gallbladder was identified, the fundus grasped and retracted cephalad. Adhesions were lysed bluntly. The infundibulum was grasped and retracted laterally, exposing the peritoneum overlying the triangle of Calot. This was then divided and exposed in a blunt fashion. An extended critical view of the cystic duct and cystic artery was obtained.  The cystic duct was clearly identified and bluntly dissected.   Artery and duct were double clipped and divided.  The gallbladder was taken from the gallbladder fossa in a retrograde fashion with the electrocautery. The gallbladder was removed and placed in an Endocatch bag. The liver bed was irrigated and inspected. Hemostasis was achieved with the electrocautery. Copious irrigation was utilized and was repeatedly aspirated until clear.  The gallbladder and Endocatch sac were then removed through a port site.  19 FR blake drain placed GB bed under visualization.  Inspection of the right upper quadrant was performed. No bleeding, bile duct injury or leak, or bowel injury was noted. Pneumoperitoneum was released.  The periumbilical port site was closed with interrumpted 0 Vicryl sutures. 4-0 subcuticular Monocryl was used to close the skin. Dermabond was  applied.  The patient was then extubated and brought to the recovery room in stable condition. Sponge, lap, and needle counts were correct at closure and at the conclusion of the case.               Sterling Bigiego Pabon, MD, FACS

## 2016-12-16 NOTE — Anesthesia Post-op Follow-up Note (Signed)
Anesthesia QCDR form completed.        

## 2016-12-16 NOTE — Anesthesia Postprocedure Evaluation (Signed)
Anesthesia Post Note  Patient: Jennifer Larson  Procedure(s) Performed: LAPAROSCOPIC CHOLECYSTECTOMY (N/A )  Patient location during evaluation: PACU Anesthesia Type: General Level of consciousness: awake and alert Pain management: pain level controlled Vital Signs Assessment: post-procedure vital signs reviewed and stable Respiratory status: spontaneous breathing, nonlabored ventilation, respiratory function stable and patient connected to nasal cannula oxygen Cardiovascular status: blood pressure returned to baseline and stable Postop Assessment: no apparent nausea or vomiting Anesthetic complications: no     Last Vitals:  Vitals:   12/16/16 1109 12/16/16 1151  BP: 128/68 133/63  Pulse: 94 93  Resp:    Temp: 36.7 C 36.7 C  SpO2: 97% 100%    Last Pain:  Vitals:   12/16/16 1203  TempSrc:   PainSc: 9                  Lenard SimmerAndrew Valery Amedee

## 2016-12-16 NOTE — Progress Notes (Signed)
Preoperative Review   Patient is met preoperatively. The history is reviewed in the chart and with the patient. I personally reviewed the options and rationale as well as the risks of this procedure that have been previously discussed with the patient. All questions asked by the patient and/or family were answered to their satisfaction.  Patient agrees to proceed with this procedure at this time.  Dhanvi Boesen M.D. FACS   

## 2016-12-17 ENCOUNTER — Encounter: Payer: Self-pay | Admitting: Surgery

## 2016-12-17 LAB — COMPREHENSIVE METABOLIC PANEL
ALBUMIN: 2.4 g/dL — AB (ref 3.5–5.0)
ALK PHOS: 65 U/L (ref 38–126)
ALT: 35 U/L (ref 14–54)
ANION GAP: 4 — AB (ref 5–15)
AST: 40 U/L (ref 15–41)
BUN: 9 mg/dL (ref 6–20)
CALCIUM: 8.4 mg/dL — AB (ref 8.9–10.3)
CO2: 26 mmol/L (ref 22–32)
Chloride: 107 mmol/L (ref 101–111)
Creatinine, Ser: 0.64 mg/dL (ref 0.44–1.00)
GFR calc non Af Amer: >60 mL/min (ref >60)
GLUCOSE: 146 mg/dL — AB (ref 65–99)
POTASSIUM: 3.9 mmol/L (ref 3.5–5.1)
SODIUM: 137 mmol/L (ref 135–145)
TOTAL PROTEIN: 6.3 g/dL — AB (ref 6.5–8.1)
Total Bilirubin: 0.2 mg/dL — ABNORMAL LOW (ref 0.3–1.2)

## 2016-12-17 LAB — CBC
HCT: 29.7 % — ABNORMAL LOW (ref 35.0–47.0)
HEMOGLOBIN: 9.8 g/dL — AB (ref 12.0–16.0)
MCH: 28.2 pg (ref 26.0–34.0)
MCHC: 33.1 g/dL (ref 32.0–36.0)
MCV: 85.3 fL (ref 80.0–100.0)
Platelets: 329 10*3/uL (ref 150–440)
RBC: 3.49 MIL/uL — ABNORMAL LOW (ref 3.80–5.20)
RDW: 13.8 % (ref 11.5–14.5)
WBC: 14.3 10*3/uL — ABNORMAL HIGH (ref 3.6–11.0)

## 2016-12-17 MED ORDER — HYDROCODONE-ACETAMINOPHEN 5-325 MG PO TABS: 1.0000 | ORAL_TABLET | Freq: Four times a day (QID) | ORAL | 0 refills | Status: DC | PRN

## 2016-12-17 MED ORDER — AMOXICILLIN-POT CLAVULANATE 875-125 MG PO TABS: 1.0000 | ORAL_TABLET | Freq: Two times a day (BID) | ORAL | 0 refills | Status: AC

## 2016-12-17 NOTE — Discharge Summary (Signed)
  Patient ID: Jennifer Larson MRN: [id number] DOB/AGE: 55/01/1960 55 y.o.  Admit date: 12/15/2016 Discharge date: 12/17/2016   Discharge Diagnoses:  Active Problems:   Acute cholecystitis   Procedures: Laparoscopic cholecystectomy  Hospital Course: 56 year old female with abdominal pain workup revealing acute cholecystitis. She was admitted to the hospital place and antibiotics hydrated and a laparoscopic cholecystectomy was performed. She had an uneventful postoperative course and was kept overnight. The time of discharge she was ambulating, tolerating a regular diet and the drain was having some serous minimal output and therefore it was removed. Her physical exam at discharge showed a female in no acute distress. Awake and alert. Abdomen: Soft, incisions healing well without evidence of infection no peritonitis. Extremities: No edema well perfused. Condition at the time of discharge is stable    Disposition: 01-Home or Self Care  Discharge Instructions    Call MD for:  difficulty breathing, headache or visual disturbances   Complete by:  As directed    Call MD for:  extreme fatigue   Complete by:  As directed    Call MD for:  hives   Complete by:  As directed    Call MD for:  persistant dizziness or light-headedness   Complete by:  As directed    Call MD for:  persistant nausea and vomiting   Complete by:  As directed    Call MD for:  redness, tenderness, or signs of infection (pain, swelling, redness, odor or green/yellow discharge around incision site)   Complete by:  As directed    Call MD for:  severe uncontrolled pain   Complete by:  As directed    Call MD for:  temperature >100.4   Complete by:  As directed    Diet - low sodium heart healthy   Complete by:  As directed    Discharge instructions   Complete by:  As directed    Shower starting tomorrow   Increase activity slowly   Complete by:  As directed    Lifting restrictions   Complete by:  As directed    20  lbs x 6 wks     Allergies as of 12/17/2016   No Known Allergies     Medication List    STOP taking these medications   traMADol 50 MG tablet Commonly known as:  ULTRAM     TAKE these medications   amoxicillin-clavulanate 875-125 MG tablet Commonly known as:  AUGMENTIN Take 1 tablet by mouth 2 (two) times daily for 7 days.   cyclobenzaprine 10 MG tablet Commonly known as:  FLEXERIL Take 1 tablet (10 mg total) by mouth 3 (three) times daily as needed.   HYDROcodone-acetaminophen 5-325 MG tablet Commonly known as:  NORCO/VICODIN Take 1-2 tablets by mouth every 6 (six) hours as needed for moderate pain.   ibuprofen 600 MG tablet Commonly known as:  ADVIL,MOTRIN Take 1 tablet (600 mg total) by mouth every 8 (eight) hours as needed.      Follow-up Information    [facility] Follow up in 10 day(s).            Sterling Bigiego Camala Talwar, MD FACS

## 2016-12-17 NOTE — Discharge Instructions (Addendum)

## 2016-12-17 NOTE — Progress Notes (Signed)
Pt IV infiltrated. Primary nurse along with another nurse tried to restart. Pt tolerating diet with no nausea. Primary nurse notified Dr. Tonita CongWoodham. Orders received to leave IV out.

## 2016-12-17 NOTE — Progress Notes (Signed)
Dr. Everlene FarrierPabon ordered discharge home. JP drain was removed per policy, per order. Patient tolerated well. Gauze dressing and tegaderm was applied over drain site. Discharge instructions, prescriptions and instructions for follow up appointment was reviewed with patient. Her daughter will be transporting her home.

## 2016-12-18 LAB — HIV ANTIBODY (ROUTINE TESTING W REFLEX): HIV Screen 4th Generation wRfx: NONREACTIVE

## 2016-12-19 LAB — SURGICAL PATHOLOGY

## 2016-12-20 ENCOUNTER — Telehealth: Payer: Self-pay | Admitting: Surgery

## 2016-12-20 NOTE — Telephone Encounter (Signed)
Patient is calling said she is having some soreness in her back and is asking if its normal, pain level is at an 8, but it does help when she takes the medication, just wondering why she's having more pain on the back side. Please call patient and advice.

## 2016-12-20 NOTE — Telephone Encounter (Signed)
Patient states two days ago she had was short of breath. It felt as if you had something pushing on her diaphragm.  It lasted a few minutes and then went away. She has not been bothered with short of breath since. She thought maybe she was over doing things, like climbing stairs.   Denies fever, chills, nausea and vomiting.  Appetite is improving. Bowels moved a little today. Stool was hard.  Patient was instructed to drink Magnesium Citrate today and if needed to use Fleets enema.  I let patient know I would speak with the doctor and call her back in regards to the shortness of breath.

## 2016-12-20 NOTE — Telephone Encounter (Signed)
Patient added to 12/21/16 schedule to see Dr.Pabon.  Patient was instructed to go to emergency room if she has anymore difficulty breathing.

## 2016-12-21 ENCOUNTER — Ambulatory Visit
Admission: RE | Admit: 2016-12-21 | Discharge: 2016-12-21 | Disposition: A | Discharge status: Home / Self Care | Payer: Self-pay | Source: Ambulatory Visit | Attending: Surgery | Admitting: Surgery

## 2016-12-21 ENCOUNTER — Ambulatory Visit (INDEPENDENT_AMBULATORY_CARE_PROVIDER_SITE_OTHER): Payer: Self-pay | Admitting: Surgery

## 2016-12-21 ENCOUNTER — Other Ambulatory Visit
Admission: RE | Admit: 2016-12-21 | Discharge: 2016-12-21 | Disposition: A | Discharge status: Home / Self Care | Payer: Self-pay | Source: Ambulatory Visit | Attending: Surgery | Admitting: Surgery

## 2016-12-21 ENCOUNTER — Encounter: Payer: Self-pay | Admitting: Surgery

## 2016-12-21 ENCOUNTER — Telehealth: Payer: Self-pay

## 2016-12-21 VITALS — BP 117/77 | HR 92 | Temp 98.3°F | Ht 64.0 in | Wt 201.4 lb

## 2016-12-21 DIAGNOSIS — R05 Cough: Secondary | ICD-10-CM

## 2016-12-21 DIAGNOSIS — J9811 Atelectasis: Secondary | ICD-10-CM | POA: Insufficient documentation

## 2016-12-21 DIAGNOSIS — R0609 Other forms of dyspnea: Secondary | ICD-10-CM

## 2016-12-21 DIAGNOSIS — R0602 Shortness of breath: Secondary | ICD-10-CM | POA: Insufficient documentation

## 2016-12-21 DIAGNOSIS — R059 Cough, unspecified: Secondary | ICD-10-CM

## 2016-12-21 LAB — CBC WITH DIFFERENTIAL/PLATELET
BASOS PCT: 2 %
Basophils Absolute: 0.2 10*3/uL — ABNORMAL HIGH (ref 0–0.1)
EOS PCT: 3 %
Eosinophils Absolute: 0.3 10*3/uL (ref 0–0.7)
HEMATOCRIT: 31.5 % — AB (ref 35.0–47.0)
HEMOGLOBIN: 10.3 g/dL — AB (ref 12.0–16.0)
LYMPHS PCT: 29 %
Lymphs Abs: 2.8 10*3/uL (ref 1.0–3.6)
MCH: 27.9 pg (ref 26.0–34.0)
MCHC: 32.7 g/dL (ref 32.0–36.0)
MCV: 85.4 fL (ref 80.0–100.0)
MONO ABS: 0.7 10*3/uL (ref 0.2–0.9)
Monocytes Relative: 7 %
NEUTROS PCT: 59 %
Neutro Abs: 5.5 10*3/uL (ref 1.4–6.5)
Platelets: 512 10*3/uL — ABNORMAL HIGH (ref 150–440)
RBC: 3.69 MIL/uL — ABNORMAL LOW (ref 3.80–5.20)
RDW: 13.9 % (ref 11.5–14.5)
WBC: 9.5 10*3/uL (ref 3.6–11.0)

## 2016-12-21 LAB — COMPREHENSIVE METABOLIC PANEL
ALT: 39 U/L (ref 14–54)
AST: 33 U/L (ref 15–41)
Albumin: 3.1 g/dL — ABNORMAL LOW (ref 3.5–5.0)
Alkaline Phosphatase: 91 U/L (ref 38–126)
Anion gap: 11 (ref 5–15)
BUN: 10 mg/dL (ref 6–20)
CHLORIDE: 101 mmol/L (ref 101–111)
CO2: 27 mmol/L (ref 22–32)
CREATININE: 0.75 mg/dL (ref 0.44–1.00)
Calcium: 9 mg/dL (ref 8.9–10.3)
GFR calc non Af Amer: >60 mL/min (ref >60)
Glucose, Bld: 104 mg/dL — ABNORMAL HIGH (ref 65–99)
Potassium: 4.1 mmol/L (ref 3.5–5.1)
SODIUM: 139 mmol/L (ref 135–145)
Total Bilirubin: 0.3 mg/dL (ref 0.3–1.2)
Total Protein: 7.7 g/dL (ref 6.5–8.1)

## 2016-12-21 NOTE — Patient Instructions (Addendum)
We would like for you to go to Radiology @ [facility]  to have a Chest xray done today.  Please go to the Lab today as well for lab work after your xray.  We would like for you to follow up with Dr[facility] Lorin [facility]Scott [facility] 12/25/16 @ 11:00 am. Ease see your follow up appointment below.

## 2016-12-21 NOTE — Progress Notes (Signed)
S/p lap chole 11/24 Doing well from abd perspective Main issue is dyspnea on exertion She states she has ahd it for a while but over the last few days it has exacerbated No fevers, taking Po, no jaundice  PE NAD Chest CTA, decrease bs bases. NSR Abd: soft, incisions healing well, no infection  A/P Dyspnea We will order cxr, cbc and cmp ? Pneumonia We will arrange appt w PCP No surgical issues

## 2016-12-21 NOTE — Telephone Encounter (Signed)
Patient notified of Chest xray and lab results.  Patient instructed to follow up with [facility] 12/25/16 @ 11:00 am. Patient verbalized understanding.

## 2016-12-28 ENCOUNTER — Encounter: Payer: Self-pay | Admitting: Surgery

## 2016-12-28 ENCOUNTER — Ambulatory Visit (INDEPENDENT_AMBULATORY_CARE_PROVIDER_SITE_OTHER): Payer: Self-pay | Admitting: Surgery

## 2016-12-28 VITALS — BP 123/85 | HR 89 | Temp 98.4°F | Ht 64.0 in | Wt 199.0 lb

## 2016-12-28 DIAGNOSIS — Z09 Encounter for follow-up examination after completed treatment for conditions other than malignant neoplasm: Secondary | ICD-10-CM

## 2016-12-28 NOTE — Progress Notes (Signed)
12/28/2016  HPI: Patient is status post laparoscopic cholecystectomy with Dr. Everlene FarrierPabon on 11/24.  She was discharged with a 7-day course of Augmentin.  She was seen in the office on 11/29 due to some dyspnea on exertion chest x-ray was obtained which showed bronchitic changes with bibasilar atelectasis.  Patient reports that her breathing is much improved now and has not had any issues with that.  Reports some mild discomfort over the incisions depending on how she moves.  Reports some nausea but she is watching what she eats because she feels that she still having some loose stools and bloating.  Vital signs: BP 123/85   Pulse 89   Temp 98.4 F (36.9 C) (Oral)   Ht 5\' 4"  (1.626 m)   Wt 90.3 kg (199 lb)   BMI 34.16 kg/m    Physical Exam: Constitutional: No acute distress Abdomen: Soft, nondistended, nontender to palpation.  All 4 incisions are clean, dry, and intact and healing well with no evidence of infection.  Assessment/Plan: 56 year old female status post laparoscopic cholecystectomy.  -Reassured the patient that currently some of the bloatedness and loose stools could be related to postoperative changes as well as the antibiotic that she was on.  These changes should dissipate and improve on the round. -Reminded patient that she still has 2.5 weeks more of no heavy lifting or pushing more than 10-15 pounds. - Patient may otherwise follow-up with us on an as-needed basis.   Howie IllJose Luis Mariel Gaudin, MD [facility]

## 2016-12-28 NOTE — Patient Instructions (Addendum)
We recommend for you to start taking Probiotics for at least 2-4 weeks.  Increase Fiber intake with foods or by taking Metamucil once a day.  At this this time you do not have a diet restriction. However, if certain foods cause you to have abdominal pain or loose stools, then stop for a couple of days and then try again.     GENERAL POST-OPERATIVE PATIENT INSTRUCTIONS   WOUND CARE INSTRUCTIONS:  Keep a dry clean dressing on the wound if there is drainage. The initial bandage may be removed after 24 hours.  Once the wound has quit draining you may leave it open to air.  If clothing rubs against the wound or causes irritation and the wound is not draining you may cover it with a dry dressing during the daytime.  Try to keep the wound dry and avoid ointments on the wound unless directed to do so.  If the wound becomes bright red and painful or starts to drain infected material that is not clear, please contact your physician immediately.  If the wound is mildly pink and has a thick firm ridge underneath it, this is normal, and is referred to as a healing ridge.  This will resolve over the next 4-6 weeks.  BATHING: You may shower if you have been informed of this by your surgeon. However, Please do not submerge in a tub, hot tub, or pool until incisions are completely sealed or have been told by your surgeon that you may do so.  DIET:  You may eat any foods that you can tolerate.  It is a good idea to eat a high fiber diet and take in plenty of fluids to prevent constipation.  If you do become constipated you may want to take a mild laxative or take ducolax tablets on a daily basis until your bowel habits are regular.  Constipation can be very uncomfortable, along with straining, after recent surgery.  ACTIVITY:  You are encouraged to cough and deep breath or use your incentive spirometer if you were given one, every 15-30 minutes when awake.  This will help prevent respiratory complications and low  grade fevers post-operatively if you had a general anesthetic.  You may want to hug a pillow when coughing and sneezing to add additional support to the surgical area, if you had abdominal or chest surgery, which will decrease pain during these times.  You are encouraged to walk and engage in light activity for the next two weeks.  You should not lift more than 20 pounds, until 02/16/2017 as it could put you at increased risk for complications.  Twenty pounds is roughly equivalent to a plastic bag of groceries. At that time- Listen to your body when lifting, if you have pain when lifting, stop and then try again in a few days. Soreness after doing exercises or activities of daily living is normal as you get back in to your normal routine.  MEDICATIONS:  Try to take narcotic medications and anti-inflammatory medications, such as tylenol, ibuprofen, naprosyn, etc., with food.  This will minimize stomach upset from the medication.  Should you develop nausea and vomiting from the pain medication, or develop a rash, please discontinue the medication and contact your physician.  You should not drive, make important decisions, or operate machinery when taking narcotic pain medication.  SUNBLOCK Use sun block to incision area over the next year if this area will be exposed to sun. This helps decrease scarring and will allow you avoid  a permanent darkened area over your incision.  QUESTIONS:  Please feel free to call our office if you have any questions, and we will be glad to assist you. [phone]

## 2018-11-29 ENCOUNTER — Ambulatory Visit (INDEPENDENT_AMBULATORY_CARE_PROVIDER_SITE_OTHER): Payer: Self-pay | Admitting: Podiatry

## 2018-11-29 ENCOUNTER — Other Ambulatory Visit: Payer: Self-pay

## 2018-11-29 DIAGNOSIS — L603 Nail dystrophy: Secondary | ICD-10-CM

## 2018-11-29 NOTE — Patient Instructions (Signed)

## 2018-12-03 NOTE — Progress Notes (Signed)
   Subjective: 58 y.o. female presenting today as a new patient with a chief complaint of discoloration and thickening of the bilateral great toenails that has been ongoing for the past several years. She reports associated pain and tenderness of the nails when wearing shoes. She states she had a culture of the nails done at another podiatrist which showed fungus. She has not had any treatment but would like the nails removed. Patient is here for further evaluation and treatment.   No past medical history on file.  Objective:  General: Well developed, nourished, in no acute distress, alert and oriented x3   Dermatology: Hyperkeratotic, discolored, thickened, onychodystrophy of the bilateral great toenails. Skin is warm, dry and supple bilateral lower extremities. Negative for open lesions or macerations.  Vascular: Dorsalis Pedis artery and Posterior Tibial artery pedal pulses palpable. No lower extremity edema noted.   Neruologic: Grossly intact via light touch bilateral.  Musculoskeletal: Muscular strength within normal limits in all groups bilateral. Normal range of motion noted to all pedal and ankle joints.   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 Patient evaluated.  2. Discussed treatment alternatives and plan of care. Explained nail avulsion procedure and post procedure course to patient. 3. Patient opted for total temporary nail avulsion of the left hallux.  4. Prior to procedure, local anesthesia infiltration utilized using 3 ml of a 50:50 mixture of 2% plain lidocaine and 0.5% plain marcaine in a normal hallux block fashion and a betadine prep performed.  5. Light dressing applied. 6. Mechanical debridement of the right great toenail performed using a nail nipper. Filed with dremel without incident.  7. Return to clinic as needed.   Edrick Kins, DPM [facility]  Dr. Edrick Kins, [place]                                         [place], [place] [address]                Office [phone]  Fax [phone]

## 2019-09-05 ENCOUNTER — Other Ambulatory Visit: Payer: Self-pay | Admitting: Family Medicine

## 2019-09-05 DIAGNOSIS — Z1231 Encounter for screening mammogram for malignant neoplasm of breast: Secondary | ICD-10-CM

## 2020-08-03 ENCOUNTER — Other Ambulatory Visit: Payer: Self-pay

## 2020-08-03 ENCOUNTER — Ambulatory Visit (INDEPENDENT_AMBULATORY_CARE_PROVIDER_SITE_OTHER): Payer: 59 | Admitting: Podiatry

## 2020-08-03 DIAGNOSIS — L603 Nail dystrophy: Secondary | ICD-10-CM

## 2020-08-03 NOTE — Progress Notes (Signed)
   Subjective: 60 y.o. female presenting today for follow-up evaluation of symptomatic toenails to the bilateral great toes has been going on for several years now.  She was last seen in 2020 where total temporary nail avulsions were performed to allow the nails to regrow with potential for normal regrowth.  Unfortunately the nails are thick and painful and sensitive in close toed shoes and with sleeping in certain positions.  She presents for further treatment and evaluation   No past medical history on file.  Objective:  General: Well developed, nourished, in no acute distress, alert and oriented x3   Dermatology: Hyperkeratotic, discolored, thickened, onychodystrophy of the bilateral great toenails. Skin is warm, dry and supple bilateral lower extremities. Negative for open lesions or macerations.  Vascular: Dorsalis Pedis artery and Posterior Tibial artery pedal pulses palpable. No lower extremity edema noted.   Neruologic: Grossly intact via light touch bilateral.  Musculoskeletal: Muscular strength within normal limits in all groups bilateral. Normal range of motion noted to all pedal and ankle joints.   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  11/2018  Plan of Care:  1. Patient evaluated.  2. Discussed treatment alternatives and plan of care. Explained nail avulsion procedure and post procedure course to patient. 3.  Since the patient has had history of total temporary nail avulsions to the bilateral great toes, I recommend that the patient's neck step would be a total permanent nail matricectomy bilateral great toes.  The patient would like to think about this. 4.  In the meantime mechanical debridement of the nails was performed using a nail nipper without incident or bleeding 5.  Return to clinic as needed  Felecia Shelling, DPM [facility]  Dr. Felecia Shelling, DPM    [address] [place], [place] [address]                Office [phone]  Fax [phone]

## 2020-11-18 ENCOUNTER — Other Ambulatory Visit: Payer: Self-pay | Admitting: Student

## 2020-11-18 DIAGNOSIS — Z1231 Encounter for screening mammogram for malignant neoplasm of breast: Secondary | ICD-10-CM

## 2021-01-25 ENCOUNTER — Ambulatory Visit
Admission: RE | Admit: 2021-01-25 | Discharge: 2021-01-25 | Disposition: A | Discharge status: Home / Self Care | Payer: 59 | Source: Ambulatory Visit | Attending: Student | Admitting: Student

## 2021-01-25 ENCOUNTER — Other Ambulatory Visit: Payer: Self-pay

## 2021-01-25 DIAGNOSIS — Z1231 Encounter for screening mammogram for malignant neoplasm of breast: Secondary | ICD-10-CM | POA: Diagnosis present

## 2022-03-27 IMAGING — MG MM DIGITAL SCREENING BILAT W/ TOMO AND CAD
8 series · 8 of 24 positions shown · non-contrast
Comparison: Previous exam(s).

CLINICAL DATA: Screening.

EXAM:
DIGITAL SCREENING BILATERAL MAMMOGRAM WITH TOMOSYNTHESIS AND CAD
TECHNIQUE: Bilateral screening digital craniocaudal and mediolateral oblique
mammograms were obtained. Bilateral screening digital breast
tomosynthesis was performed. The images were evaluated with
computer-aided detection.

[R MLO synth-2D]
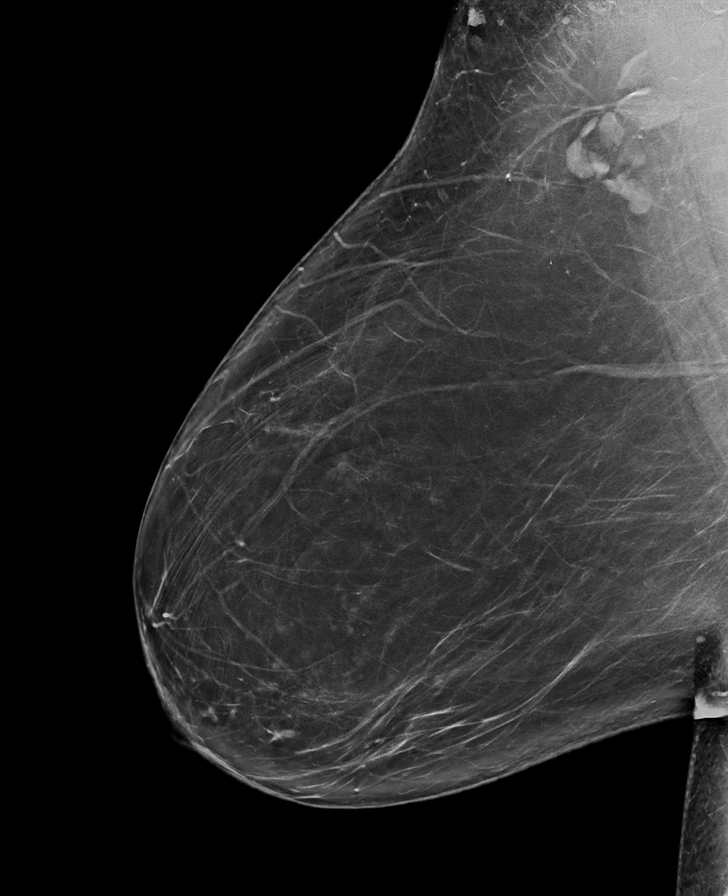

[L CC synth-2D]
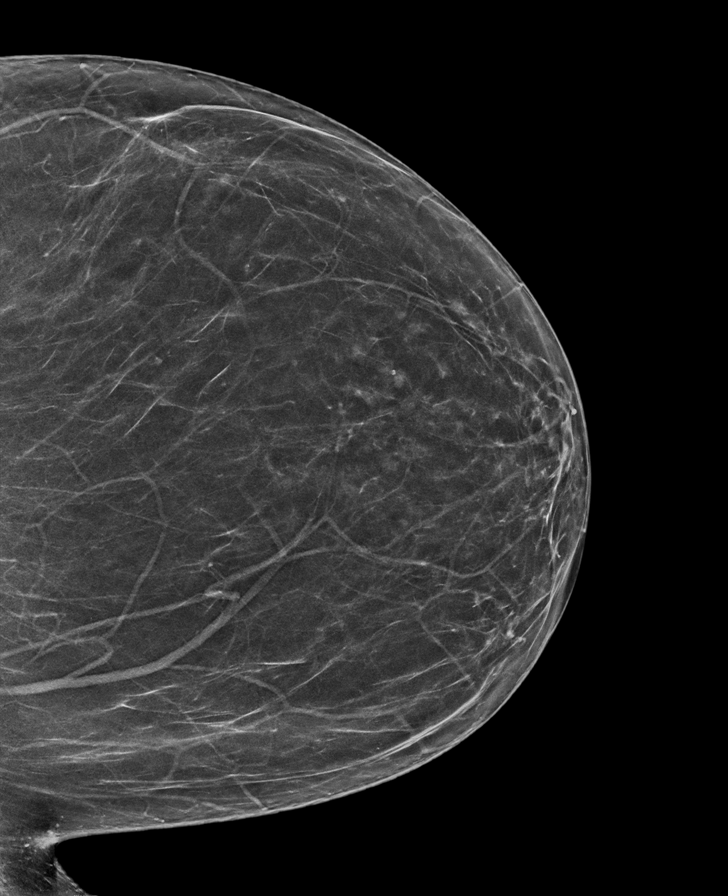

[R CC synth-2D]
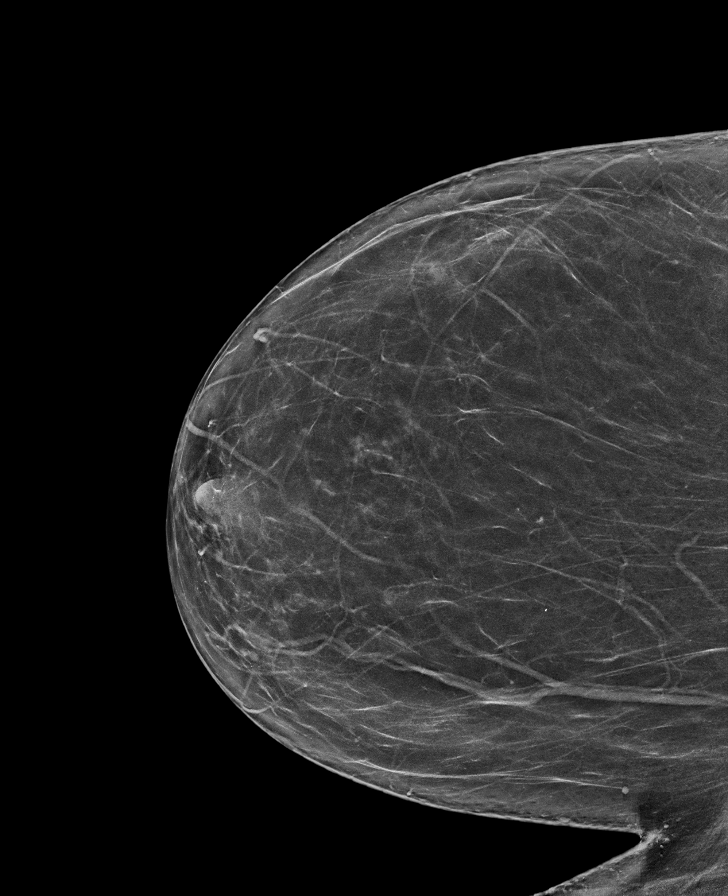

[L MLO synth-2D]
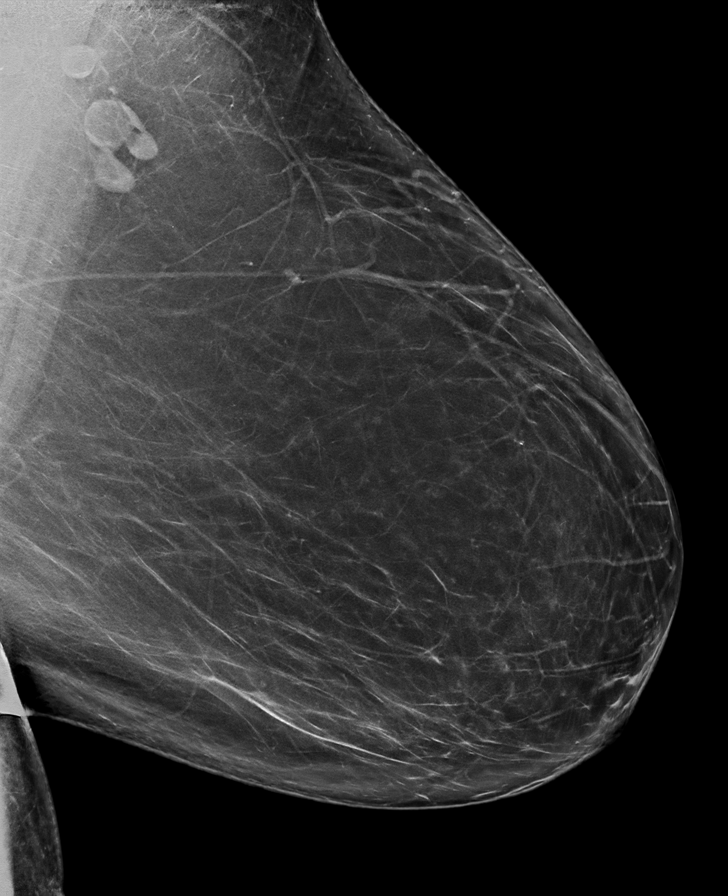

[L MLO tomo · tomo slice 43/84.0]
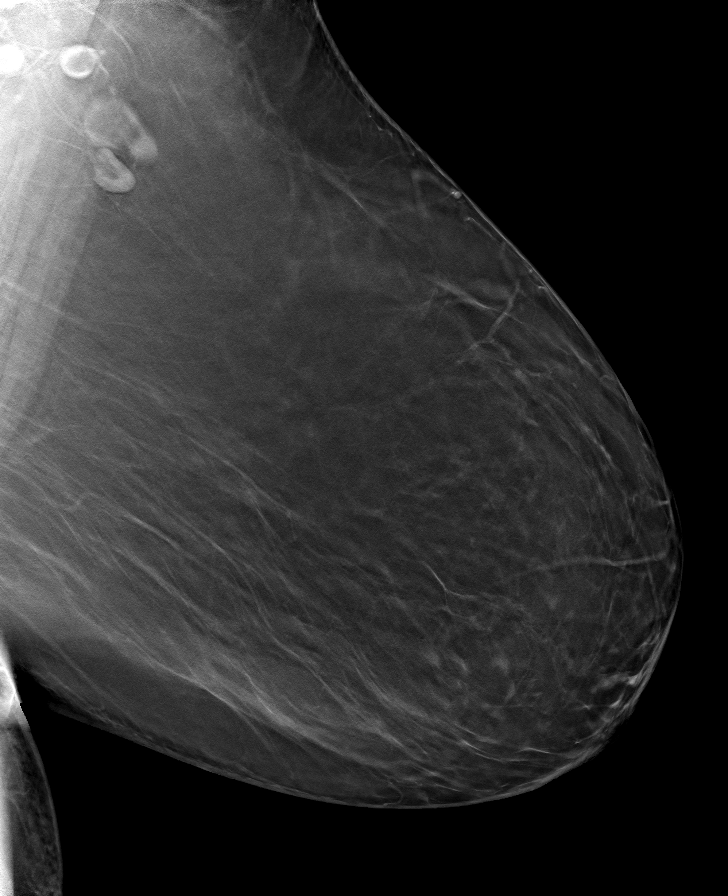

[L CC tomo · tomo slice 33/64.0]
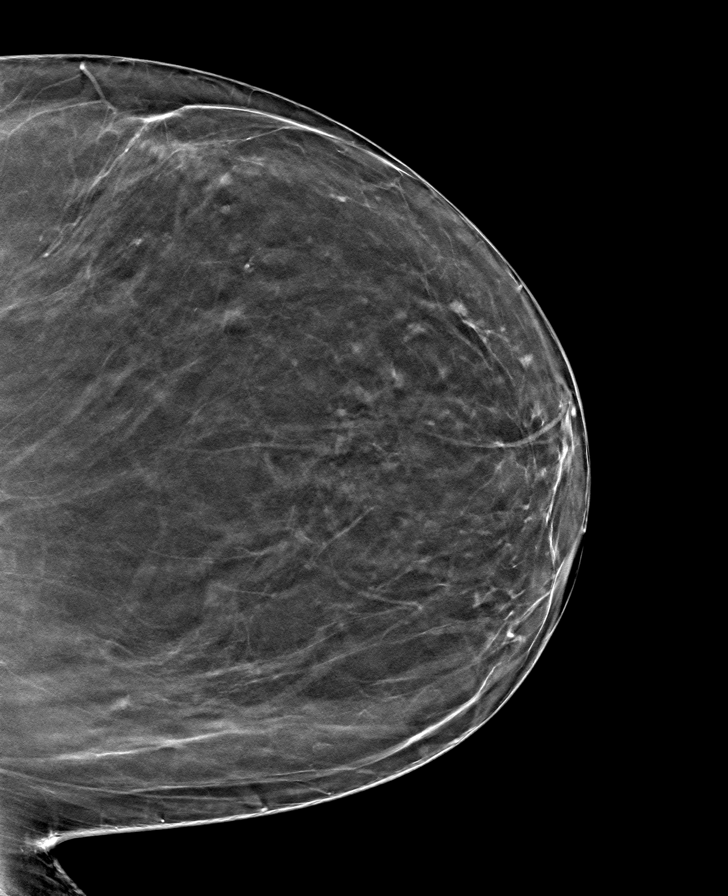

[R MLO tomo · tomo slice 44/87.0]
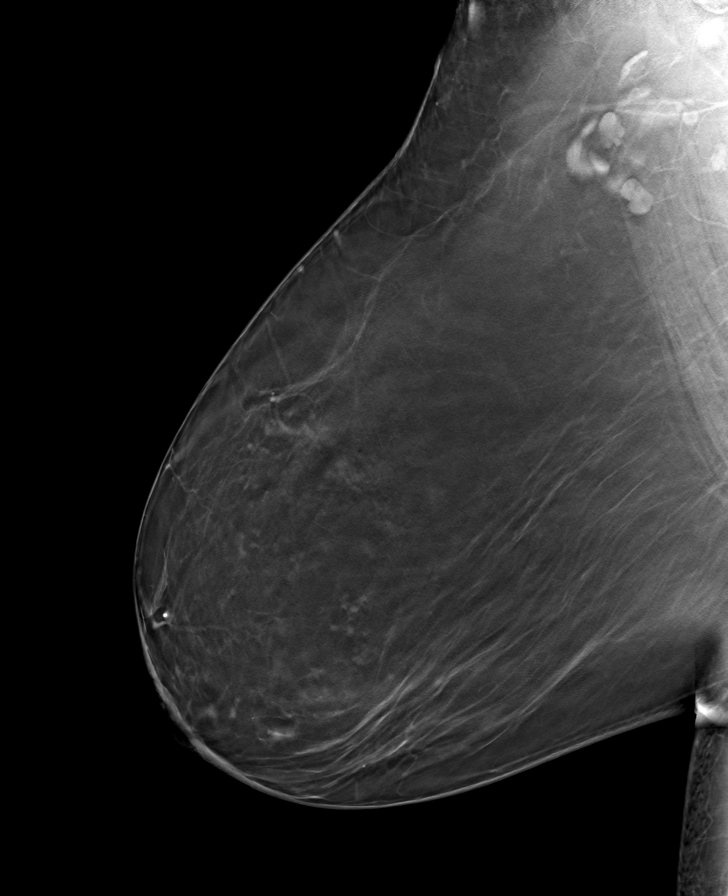

[R CC tomo · tomo slice 37/74.0]
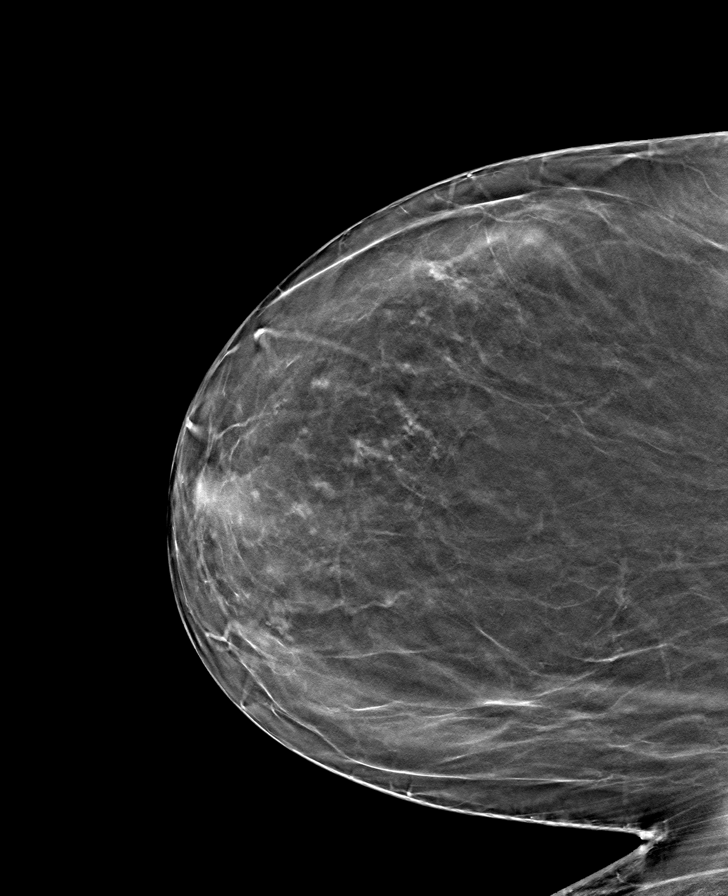

[8 of 24 positions shown; findings below may reference images not displayed]

ACR Breast Density Category b: There are scattered areas of
fibroglandular density.
FINDINGS: There are no findings suspicious for malignancy.
IMPRESSION: No mammographic evidence of malignancy. A result letter of this
screening mammogram will be mailed directly to the patient.

RECOMMENDATION:
Screening mammogram in one year. (Code:51-O-LD2)

BI-RADS CATEGORY  1: Negative.

## 2023-04-27 ENCOUNTER — Ambulatory Visit (INDEPENDENT_AMBULATORY_CARE_PROVIDER_SITE_OTHER): Payer: Self-pay | Admitting: Podiatry

## 2023-04-27 ENCOUNTER — Encounter: Payer: Self-pay | Admitting: Podiatry

## 2023-04-27 VITALS — Ht 64.0 in | Wt 200.0 lb

## 2023-04-27 DIAGNOSIS — L600 Ingrowing nail: Secondary | ICD-10-CM

## 2023-04-27 MED ORDER — HYDROCODONE-ACETAMINOPHEN 10-325 MG PO TABS: 1.0000 | ORAL_TABLET | ORAL | 0 refills | Status: AC | PRN

## 2023-04-27 NOTE — Patient Instructions (Signed)

## 2023-04-27 NOTE — Progress Notes (Signed)
   Chief Complaint  Patient presents with   Ingrown Toenail    Bilateral nail removal( hallux)     Subjective: Patient presents today for evaluation of pain to the bilateral great toenail plates.  Patient has a history of temporary nail avulsions in the past due to pain associated to the nail plates and ingrown toenails with nail dystrophy.  Patient states that the toenails have grown back just a symptomatic and painful and tender.  History reviewed. No pertinent past medical history.  Past Surgical History:  Procedure Laterality Date   CHOLECYSTECTOMY N/A 12/16/2016   Procedure: LAPAROSCOPIC CHOLECYSTECTOMY;  Surgeon: Alben Alma, MD;  Location: [facility] ORS;  Service: General;  Laterality: N/A;   TUBAL LIGATION      No Known Allergies  Objective:  General: Well developed, nourished, in no acute distress, alert and oriented x3   Dermatology: Skin is warm, dry and supple bilateral.  Bilateral great toenails tender with nail dystrophy and thickening noted.  Vascular: DP and PT pulses palpable.  No clinical evidence of vascular compromise  Neruologic: Grossly intact via light touch bilateral.  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  -Discussed treatment alternatives and plan of care. Explained nail avulsion procedure and post procedure course to patient. -Patient opted for permanent total nail avulsion of the bilateral great toenail plates -Prior to procedure, local anesthesia infiltration utilized using 3 ml of a 50:50 mixture of 2% plain lidocaine  and 0.5% plain marcaine  in a normal hallux block fashion and a betadine prep performed.  -Partial permanent nail avulsion with chemical matrixectomy performed using 3x30sec applications of phenol followed by alcohol flush.  -Light  dressing applied.  Post care instructions provided -Return to clinic 3 weeks  Dot Gazella, DPM [facility]  Dr. Dot Gazella, DPM    [address] [place], [place] [address]                Office [phone]  Fax [phone]

## 2023-06-01 ENCOUNTER — Encounter: Payer: Self-pay | Admitting: Podiatry

## 2023-06-01 ENCOUNTER — Ambulatory Visit (INDEPENDENT_AMBULATORY_CARE_PROVIDER_SITE_OTHER): Admitting: Podiatry

## 2023-06-01 DIAGNOSIS — L600 Ingrowing nail: Secondary | ICD-10-CM | POA: Diagnosis not present

## 2023-06-01 NOTE — Progress Notes (Signed)
   Chief Complaint  Patient presents with   Nail Problem    "They're fine but why do they look so scaly?"    Subjective: 63 y.o. female presents today status post permanent nail avulsion procedure of the bilateral great toenails that was performed on 04/27/2023.  Patient doing well.  She no longer has any pain or tenderness associated to the toes..   No past medical history on file.  Objective: Neurovascular status intact.  Skin is warm, dry and supple.  Nailbed appears to be healing nicely.  No drainage.  There is some superficial eschar to portions of the toes which appear to be healing nicely  Assessment: #1 s/p total permanent nail matrixectomy bilateral great toes   Plan of care: #1 patient was evaluated  #2 light debridement of the nailbed was performed to the border of the respective toe and nail plate using a tissue nipper. #3 patient is to return to clinic on a PRN basis.   Dot Gazella, DPM [facility]  Dr. Dot Gazella, DPM    [address] [place], [place] [address]                Office [phone]  Fax [phone]
